# Patient Record
Sex: Male | Born: 1940
Health system: Southern US, Community
[De-identification: ages and names within clinical notes are randomized; demographics above are authoritative.]

## PROBLEM LIST (undated history)

## (undated) DIAGNOSIS — I998 Other disorder of circulatory system: Secondary | ICD-10-CM

## (undated) DIAGNOSIS — B029 Zoster without complications: Secondary | ICD-10-CM

## (undated) DIAGNOSIS — I219 Acute myocardial infarction, unspecified: Secondary | ICD-10-CM

## (undated) DIAGNOSIS — E785 Hyperlipidemia, unspecified: Secondary | ICD-10-CM

## (undated) DIAGNOSIS — I10 Essential (primary) hypertension: Secondary | ICD-10-CM

## (undated) DIAGNOSIS — K219 Gastro-esophageal reflux disease without esophagitis: Secondary | ICD-10-CM

## (undated) DIAGNOSIS — K317 Polyp of stomach and duodenum: Secondary | ICD-10-CM

## (undated) DIAGNOSIS — Z860100 Personal history of colon polyps, unspecified: Secondary | ICD-10-CM

## (undated) DIAGNOSIS — F419 Anxiety disorder, unspecified: Secondary | ICD-10-CM

## (undated) DIAGNOSIS — I2510 Atherosclerotic heart disease of native coronary artery without angina pectoris: Secondary | ICD-10-CM

## (undated) DIAGNOSIS — M1990 Unspecified osteoarthritis, unspecified site: Secondary | ICD-10-CM

## (undated) DIAGNOSIS — W3400XA Accidental discharge from unspecified firearms or gun, initial encounter: Secondary | ICD-10-CM

## (undated) HISTORY — PX: COLONOSCOPY W/ BIOPSIES: SHX1374

## (undated) HISTORY — DX: Acute myocardial infarction, unspecified: I21.9

## (undated) HISTORY — DX: Personal history of colon polyps, unspecified: Z86.0100

## (undated) HISTORY — DX: Atherosclerotic heart disease of native coronary artery without angina pectoris: I25.10

## (undated) HISTORY — DX: Hyperlipidemia, unspecified: E78.5

## (undated) HISTORY — DX: Gastro-esophageal reflux disease without esophagitis: K21.9

## (undated) HISTORY — DX: Accidental discharge from unspecified firearms or gun, initial encounter: W34.00XA

## (undated) HISTORY — DX: Essential (primary) hypertension: I10

## (undated) HISTORY — DX: Anxiety disorder, unspecified: F41.9

## (undated) HISTORY — DX: Other disorder of circulatory system: I99.8

## (undated) HISTORY — DX: Personal history of colonic polyps: Z86.010

## (undated) HISTORY — DX: Zoster without complications: B02.9

## (undated) HISTORY — DX: Unspecified osteoarthritis, unspecified site: M19.90

## (undated) HISTORY — PX: PENILE PROSTHESIS IMPLANT: SHX240

## (undated) HISTORY — PX: LUMBAR SPINE SURGERY: SHX701

## (undated) HISTORY — DX: Polyp of stomach and duodenum: K31.7

---

## 1967-03-17 DIAGNOSIS — W3400XA Accidental discharge from unspecified firearms or gun, initial encounter: Secondary | ICD-10-CM

## 1967-03-17 HISTORY — DX: Accidental discharge from unspecified firearms or gun, initial encounter: W34.00XA

## 1995-03-17 DIAGNOSIS — I219 Acute myocardial infarction, unspecified: Secondary | ICD-10-CM

## 1995-03-17 HISTORY — DX: Acute myocardial infarction, unspecified: I21.9

## 1995-03-17 HISTORY — PX: ANGIOPLASTY: SHX39

## 2001-08-14 LAB — HM DEXA SCAN: HM Dexa Scan: NORMAL

## 2001-08-30 ENCOUNTER — Encounter: Payer: Self-pay | Admitting: Internal Medicine

## 2003-04-05 ENCOUNTER — Encounter: Payer: Self-pay | Admitting: Internal Medicine

## 2003-10-12 ENCOUNTER — Encounter: Payer: Self-pay | Admitting: Internal Medicine

## 2004-03-07 ENCOUNTER — Ambulatory Visit: Payer: Self-pay | Admitting: Internal Medicine

## 2004-03-21 ENCOUNTER — Ambulatory Visit: Payer: Self-pay | Admitting: Internal Medicine

## 2004-07-18 ENCOUNTER — Ambulatory Visit: Payer: Self-pay | Admitting: Internal Medicine

## 2004-08-19 ENCOUNTER — Ambulatory Visit: Payer: Self-pay | Admitting: Internal Medicine

## 2004-11-21 ENCOUNTER — Ambulatory Visit: Payer: Self-pay | Admitting: Internal Medicine

## 2004-12-26 ENCOUNTER — Ambulatory Visit: Payer: Self-pay | Admitting: Internal Medicine

## 2005-01-12 ENCOUNTER — Ambulatory Visit: Payer: Self-pay | Admitting: Internal Medicine

## 2005-01-12 ENCOUNTER — Encounter: Admission: RE | Admit: 2005-01-12 | Discharge: 2005-01-12 | Payer: Self-pay | Admitting: Neurosurgery

## 2005-01-14 DIAGNOSIS — I998 Other disorder of circulatory system: Secondary | ICD-10-CM

## 2005-01-14 HISTORY — DX: Other disorder of circulatory system: I99.8

## 2005-01-23 ENCOUNTER — Ambulatory Visit: Payer: Self-pay | Admitting: Cardiology

## 2005-01-30 ENCOUNTER — Ambulatory Visit: Payer: Self-pay

## 2005-02-25 ENCOUNTER — Encounter: Admission: RE | Admit: 2005-02-25 | Discharge: 2005-02-25 | Payer: Self-pay | Admitting: Neurosurgery

## 2005-08-01 ENCOUNTER — Encounter: Payer: Self-pay | Admitting: Internal Medicine

## 2005-08-01 ENCOUNTER — Emergency Department (HOSPITAL_COMMUNITY): Admission: EM | Admit: 2005-08-01 | Discharge: 2005-08-01 | Payer: Self-pay | Admitting: Emergency Medicine

## 2005-08-06 ENCOUNTER — Ambulatory Visit: Payer: Self-pay | Admitting: Internal Medicine

## 2005-08-12 ENCOUNTER — Encounter: Payer: Self-pay | Admitting: Internal Medicine

## 2005-09-14 ENCOUNTER — Encounter: Admission: RE | Admit: 2005-09-14 | Discharge: 2005-09-14 | Payer: Self-pay | Admitting: Neurosurgery

## 2005-12-15 ENCOUNTER — Ambulatory Visit: Payer: Self-pay | Admitting: Internal Medicine

## 2005-12-21 ENCOUNTER — Ambulatory Visit: Payer: Self-pay | Admitting: Cardiovascular Disease

## 2005-12-29 ENCOUNTER — Encounter: Admission: RE | Admit: 2005-12-29 | Discharge: 2005-12-29 | Payer: Self-pay | Admitting: Neurosurgery

## 2006-03-17 ENCOUNTER — Ambulatory Visit: Payer: Self-pay | Admitting: Cardiovascular Disease

## 2006-04-26 ENCOUNTER — Ambulatory Visit: Payer: Self-pay | Admitting: Internal Medicine

## 2006-05-25 ENCOUNTER — Ambulatory Visit: Payer: Self-pay | Admitting: Internal Medicine

## 2006-06-22 ENCOUNTER — Encounter: Admission: RE | Admit: 2006-06-22 | Discharge: 2006-06-22 | Payer: Self-pay | Admitting: Orthopedic Surgery

## 2006-06-25 ENCOUNTER — Ambulatory Visit: Payer: Self-pay | Admitting: Cardiovascular Disease

## 2006-07-15 HISTORY — PX: LUMBAR FUSION: SHX111

## 2006-07-29 ENCOUNTER — Observation Stay (HOSPITAL_COMMUNITY): Admission: RE | Admit: 2006-07-29 | Discharge: 2006-07-30 | Payer: Self-pay | Admitting: Orthopedic Surgery

## 2006-08-06 ENCOUNTER — Telehealth (INDEPENDENT_AMBULATORY_CARE_PROVIDER_SITE_OTHER): Payer: Self-pay | Admitting: *Deleted

## 2006-09-01 DIAGNOSIS — E785 Hyperlipidemia, unspecified: Secondary | ICD-10-CM

## 2006-09-01 DIAGNOSIS — K219 Gastro-esophageal reflux disease without esophagitis: Secondary | ICD-10-CM | POA: Insufficient documentation

## 2006-09-01 DIAGNOSIS — M109 Gout, unspecified: Secondary | ICD-10-CM

## 2006-09-01 DIAGNOSIS — I25119 Atherosclerotic heart disease of native coronary artery with unspecified angina pectoris: Secondary | ICD-10-CM | POA: Insufficient documentation

## 2006-09-01 DIAGNOSIS — I10 Essential (primary) hypertension: Secondary | ICD-10-CM

## 2006-09-01 DIAGNOSIS — F411 Generalized anxiety disorder: Secondary | ICD-10-CM | POA: Insufficient documentation

## 2006-09-01 DIAGNOSIS — M1990 Unspecified osteoarthritis, unspecified site: Secondary | ICD-10-CM | POA: Insufficient documentation

## 2006-09-02 ENCOUNTER — Ambulatory Visit: Payer: Self-pay | Admitting: Internal Medicine

## 2006-09-14 ENCOUNTER — Ambulatory Visit: Payer: Self-pay | Admitting: Internal Medicine

## 2006-09-14 ENCOUNTER — Encounter: Payer: Self-pay | Admitting: Internal Medicine

## 2006-09-20 ENCOUNTER — Ambulatory Visit: Payer: Self-pay | Admitting: Internal Medicine

## 2006-09-21 ENCOUNTER — Encounter: Payer: Self-pay | Admitting: Internal Medicine

## 2006-09-21 LAB — CONVERTED CEMR LAB
Albumin: 3.3 g/dL — ABNORMAL LOW (ref 3.5–5.2)
Calcium: 9.2 mg/dL (ref 8.4–10.5)
Chloride: 111 meq/L (ref 96–112)
GFR calc non Af Amer: 50 mL/min
HDL: 41.9 mg/dL (ref >39.0)
LDL Cholesterol: 100 mg/dL — ABNORMAL HIGH (ref 0–99)
Phosphorus: 2.9 mg/dL (ref 2.3–4.6)
Potassium: 4.6 meq/L (ref 3.5–5.1)
Sodium: 143 meq/L (ref 135–145)
Triglycerides: 125 mg/dL (ref 0–149)

## 2006-12-14 ENCOUNTER — Ambulatory Visit: Payer: Self-pay | Admitting: Internal Medicine

## 2006-12-14 DIAGNOSIS — M069 Rheumatoid arthritis, unspecified: Secondary | ICD-10-CM

## 2006-12-14 LAB — CONVERTED CEMR LAB: Anti Nuclear Antibody(ANA): NEGATIVE

## 2006-12-17 LAB — CONVERTED CEMR LAB
Albumin: 3.5 g/dL (ref 3.5–5.2)
Alkaline Phosphatase: 75 units/L (ref 39–117)
Basophils Absolute: 0 10*3/uL (ref 0.0–0.1)
Bilirubin, Direct: 0.1 mg/dL (ref 0.0–0.3)
CO2: 30 meq/L (ref 19–32)
Calcium: 9.3 mg/dL (ref 8.4–10.5)
Eosinophils Absolute: 0.2 10*3/uL (ref 0.0–0.6)
Eosinophils Relative: 2.9 % (ref 0.0–5.0)
GFR calc non Af Amer: 50 mL/min
HCT: 41 % (ref 39.0–52.0)
Lymphocytes Relative: 32.9 % (ref 12.0–46.0)
MCV: 83.9 fL (ref 78.0–100.0)
Neutro Abs: 3.3 10*3/uL (ref 1.4–7.7)
Neutrophils Relative %: 52.8 % (ref 43.0–77.0)
Phosphorus: 2.9 mg/dL (ref 2.3–4.6)
Potassium: 4.5 meq/L (ref 3.5–5.1)
Sed Rate: 11 mm/hr (ref 0–20)
WBC: 6.3 10*3/uL (ref 4.5–10.5)

## 2006-12-31 ENCOUNTER — Ambulatory Visit: Payer: Self-pay | Admitting: Internal Medicine

## 2007-01-13 ENCOUNTER — Ambulatory Visit: Payer: Self-pay | Admitting: Internal Medicine

## 2007-02-17 ENCOUNTER — Ambulatory Visit: Payer: Self-pay | Admitting: Internal Medicine

## 2007-02-18 LAB — CONVERTED CEMR LAB
ALT: 33 U/L
AST: 34 U/L
Albumin: 3.5 g/dL
Alkaline Phosphatase: 72 U/L
BUN: 21 mg/dL
Basophils Absolute: 0 10*3/uL
Basophils Relative: 0.1 %
Bilirubin, Direct: 0.1 mg/dL
CO2: 29 meq/L
Calcium: 9.4 mg/dL
Chloride: 108 meq/L
Creatinine, Ser: 1.3 mg/dL
Eosinophils Absolute: 0.3 10*3/uL
Eosinophils Relative: 5.1 % — ABNORMAL HIGH
GFR calc Af Amer: 71 mL/min
GFR calc non Af Amer: 59 mL/min
Glucose, Bld: 94 mg/dL
HCT: 39.7 %
Hemoglobin: 13.7 g/dL
Lymphocytes Relative: 34.2 %
MCHC: 34.4 g/dL
MCV: 85.1 fL
Monocytes Absolute: 0.8 10*3/uL — ABNORMAL HIGH
Monocytes Relative: 13 % — ABNORMAL HIGH
Neutro Abs: 3 10*3/uL
Neutrophils Relative %: 47.6 %
Phosphorus: 3.9 mg/dL
Platelets: 211 10*3/uL
Potassium: 4.9 meq/L
RBC: 4.67 M/uL
RDW: 15.3 % — ABNORMAL HIGH
Sodium: 141 meq/L
Total Bilirubin: 0.6 mg/dL
Total Protein: 6.7 g/dL
WBC: 6.2 10*3/uL

## 2007-04-07 ENCOUNTER — Ambulatory Visit: Payer: Self-pay | Admitting: Internal Medicine

## 2007-04-08 LAB — CONVERTED CEMR LAB
BUN: 18 mg/dL (ref 6–23)
Basophils Absolute: 0 10*3/uL (ref 0.0–0.1)
Basophils Relative: 0.6 % (ref 0.0–1.0)
Bilirubin, Direct: 0.1 mg/dL (ref 0.0–0.3)
Calcium: 9.6 mg/dL (ref 8.4–10.5)
Chloride: 105 meq/L (ref 96–112)
Eosinophils Relative: 4.2 % (ref 0.0–5.0)
GFR calc non Af Amer: 54 mL/min
Lymphocytes Relative: 31.3 % (ref 12.0–46.0)
Neutrophils Relative %: 53.5 % (ref 43.0–77.0)
Phosphorus: 3.8 mg/dL (ref 2.3–4.6)
Platelets: 180 10*3/uL (ref 150–400)
Potassium: 4.8 meq/L (ref 3.5–5.1)
RDW: 13.9 % (ref 11.5–14.6)
Total Protein: 6.7 g/dL (ref 6.0–8.3)
WBC: 7.4 10*3/uL (ref 4.5–10.5)

## 2007-06-15 ENCOUNTER — Ambulatory Visit: Payer: Self-pay | Admitting: Cardiovascular Disease

## 2007-06-15 HISTORY — PX: CORONARY ANGIOPLASTY WITH STENT PLACEMENT: SHX49

## 2007-06-15 LAB — CONVERTED CEMR LAB
Alkaline Phosphatase: 66 units/L (ref 39–117)
Basophils Absolute: 0.1 10*3/uL (ref 0.0–0.1)
Bilirubin, Direct: 0.1 mg/dL (ref 0.0–0.3)
Cholesterol: 163 mg/dL (ref 0–200)
Creatinine, Ser: 1.5 mg/dL (ref 0.4–1.5)
GFR calc non Af Amer: 49 mL/min
Glucose, Bld: 88 mg/dL (ref 70–99)
INR: 0.9 (ref 0.8–1.0)
LDL Cholesterol: 105 mg/dL — ABNORMAL HIGH (ref 0–99)
Monocytes Absolute: 1 10*3/uL (ref 0.1–1.0)
Neutro Abs: 2.5 10*3/uL (ref 1.4–7.7)
Platelets: 168 10*3/uL (ref 150–400)
Potassium: 4.4 meq/L (ref 3.5–5.1)
RBC: 4.96 M/uL (ref 4.22–5.81)
RDW: 14.7 % — ABNORMAL HIGH (ref 11.5–14.6)
Total CHOL/HDL Ratio: 5
Triglycerides: 129 mg/dL (ref 0–149)
WBC: 5.6 10*3/uL (ref 4.5–10.5)

## 2007-06-21 ENCOUNTER — Inpatient Hospital Stay (HOSPITAL_BASED_OUTPATIENT_CLINIC_OR_DEPARTMENT_OTHER): Admission: RE | Admit: 2007-06-21 | Discharge: 2007-06-21 | Payer: Self-pay | Admitting: Cardiovascular Disease

## 2007-06-21 ENCOUNTER — Encounter: Payer: Self-pay | Admitting: Internal Medicine

## 2007-06-21 ENCOUNTER — Ambulatory Visit: Payer: Self-pay | Admitting: Cardiovascular Disease

## 2007-07-08 ENCOUNTER — Ambulatory Visit: Payer: Self-pay | Admitting: Internal Medicine

## 2007-07-12 LAB — CONVERTED CEMR LAB
ALT: 27 units/L (ref 0–53)
Albumin: 3.5 g/dL (ref 3.5–5.2)
Basophils Absolute: 0 10*3/uL (ref 0.0–0.1)
Basophils Relative: 0.4 % (ref 0.0–1.0)
CRP, High Sensitivity: <1 — ABNORMAL LOW (ref 0.00–5.00)
Eosinophils Absolute: 0.2 10*3/uL (ref 0.0–0.7)
Eosinophils Relative: 3.2 % (ref 0.0–5.0)
HCT: 43.4 % (ref 39.0–52.0)
MCHC: 33.8 g/dL (ref 30.0–36.0)
MCV: 86.5 fL (ref 78.0–100.0)
Monocytes Absolute: 0.7 10*3/uL (ref 0.1–1.0)
Neutrophils Relative %: 49.7 % (ref 43.0–77.0)
Platelets: 240 10*3/uL (ref 150–400)
RBC: 5.02 M/uL (ref 4.22–5.81)
RDW: 14.9 % — ABNORMAL HIGH (ref 11.5–14.6)
Total Bilirubin: 0.7 mg/dL (ref 0.3–1.2)
WBC: 6.8 10*3/uL (ref 4.5–10.5)

## 2007-08-15 ENCOUNTER — Encounter (INDEPENDENT_AMBULATORY_CARE_PROVIDER_SITE_OTHER): Payer: Self-pay | Admitting: *Deleted

## 2007-08-18 ENCOUNTER — Ambulatory Visit: Payer: Self-pay | Admitting: Internal Medicine

## 2007-09-07 ENCOUNTER — Encounter: Payer: Self-pay | Admitting: Internal Medicine

## 2007-10-05 ENCOUNTER — Encounter: Payer: Self-pay | Admitting: Internal Medicine

## 2007-12-13 ENCOUNTER — Ambulatory Visit: Payer: Self-pay | Admitting: Internal Medicine

## 2008-01-27 ENCOUNTER — Ambulatory Visit: Payer: Self-pay | Admitting: Cardiovascular Disease

## 2008-06-01 ENCOUNTER — Ambulatory Visit: Payer: Self-pay | Admitting: Family Medicine

## 2008-06-01 DIAGNOSIS — J309 Allergic rhinitis, unspecified: Secondary | ICD-10-CM | POA: Insufficient documentation

## 2008-06-05 ENCOUNTER — Ambulatory Visit: Payer: Self-pay | Admitting: Family Medicine

## 2008-06-05 DIAGNOSIS — R339 Retention of urine, unspecified: Secondary | ICD-10-CM | POA: Insufficient documentation

## 2008-06-06 ENCOUNTER — Encounter: Payer: Self-pay | Admitting: Internal Medicine

## 2008-06-11 ENCOUNTER — Ambulatory Visit: Payer: Self-pay | Admitting: Internal Medicine

## 2008-07-10 ENCOUNTER — Telehealth: Payer: Self-pay | Admitting: Internal Medicine

## 2008-07-12 ENCOUNTER — Telehealth: Payer: Self-pay | Admitting: Cardiovascular Disease

## 2008-07-16 ENCOUNTER — Telehealth: Payer: Self-pay | Admitting: Cardiovascular Disease

## 2008-07-31 ENCOUNTER — Encounter: Payer: Self-pay | Admitting: Internal Medicine

## 2008-08-02 ENCOUNTER — Encounter: Payer: Self-pay | Admitting: Internal Medicine

## 2008-08-07 ENCOUNTER — Ambulatory Visit: Payer: Self-pay | Admitting: Internal Medicine

## 2008-08-08 LAB — CONVERTED CEMR LAB
Albumin: 3.5 g/dL (ref 3.5–5.2)
Alkaline Phosphatase: 74 units/L (ref 39–117)
Basophils Absolute: 0.1 10*3/uL (ref 0.0–0.1)
Basophils Relative: 1.2 % (ref 0.0–3.0)
Chloride: 113 meq/L — ABNORMAL HIGH (ref 96–112)
Creatinine, Ser: 1.4 mg/dL (ref 0.4–1.5)
Eosinophils Absolute: 0.3 10*3/uL (ref 0.0–0.7)
Eosinophils Relative: 4.6 % (ref 0.0–5.0)
HDL: 41.9 mg/dL (ref >39.00)
LDL Cholesterol: 104 mg/dL — ABNORMAL HIGH (ref 0–99)
MCHC: 34.8 g/dL (ref 30.0–36.0)
Monocytes Absolute: 0.8 10*3/uL (ref 0.1–1.0)
Neutro Abs: 3 10*3/uL (ref 1.4–7.7)
RBC: 4.36 M/uL (ref 4.22–5.81)
RDW: 14.5 % (ref 11.5–14.6)
Sodium: 142 meq/L (ref 135–145)
WBC: 6.3 10*3/uL (ref 4.5–10.5)

## 2008-08-09 ENCOUNTER — Telehealth: Payer: Self-pay | Admitting: Internal Medicine

## 2008-08-14 ENCOUNTER — Telehealth: Payer: Self-pay | Admitting: Internal Medicine

## 2008-08-22 ENCOUNTER — Ambulatory Visit: Payer: Self-pay | Admitting: Internal Medicine

## 2008-08-22 DIAGNOSIS — R609 Edema, unspecified: Secondary | ICD-10-CM

## 2008-08-22 LAB — CONVERTED CEMR LAB
Bilirubin Urine: NEGATIVE
Blood in Urine, dipstick: NEGATIVE
Nitrite: NEGATIVE
Protein, U semiquant: NEGATIVE
Urobilinogen, UA: 0.2

## 2008-08-23 ENCOUNTER — Encounter: Payer: Self-pay | Admitting: Internal Medicine

## 2008-08-24 ENCOUNTER — Ambulatory Visit: Payer: Self-pay | Admitting: Cardiovascular Disease

## 2008-09-04 ENCOUNTER — Ambulatory Visit: Payer: Self-pay

## 2008-09-04 ENCOUNTER — Encounter: Payer: Self-pay | Admitting: Cardiovascular Disease

## 2008-09-05 ENCOUNTER — Telehealth: Payer: Self-pay | Admitting: Internal Medicine

## 2008-09-05 ENCOUNTER — Ambulatory Visit: Payer: Self-pay | Admitting: Internal Medicine

## 2008-09-06 LAB — CONVERTED CEMR LAB
BUN: 28 mg/dL — ABNORMAL HIGH (ref 6–23)
Chloride: 107 meq/L (ref 96–112)
Creatinine, Ser: 1.5 mg/dL (ref 0.4–1.5)
Potassium: 4.3 meq/L (ref 3.5–5.1)

## 2008-09-21 ENCOUNTER — Encounter: Payer: Self-pay | Admitting: Internal Medicine

## 2008-10-19 ENCOUNTER — Ambulatory Visit: Payer: Self-pay | Admitting: Internal Medicine

## 2008-10-25 ENCOUNTER — Ambulatory Visit: Payer: Self-pay | Admitting: Internal Medicine

## 2008-10-31 ENCOUNTER — Telehealth: Payer: Self-pay | Admitting: Internal Medicine

## 2008-11-02 ENCOUNTER — Encounter: Payer: Self-pay | Admitting: Internal Medicine

## 2008-12-06 ENCOUNTER — Encounter: Payer: Self-pay | Admitting: Internal Medicine

## 2008-12-20 ENCOUNTER — Ambulatory Visit: Payer: Self-pay | Admitting: Internal Medicine

## 2009-01-03 ENCOUNTER — Ambulatory Visit: Payer: Self-pay | Admitting: Internal Medicine

## 2009-01-03 DIAGNOSIS — Z860100 Personal history of colon polyps, unspecified: Secondary | ICD-10-CM | POA: Insufficient documentation

## 2009-01-30 ENCOUNTER — Telehealth: Payer: Self-pay | Admitting: Internal Medicine

## 2009-01-30 ENCOUNTER — Encounter: Payer: Self-pay | Admitting: Internal Medicine

## 2009-02-12 ENCOUNTER — Ambulatory Visit: Payer: Self-pay | Admitting: Internal Medicine

## 2009-02-14 LAB — CONVERTED CEMR LAB
ALT: 23 units/L (ref 0–53)
AST: 26 units/L (ref 0–37)
Albumin: 3.6 g/dL (ref 3.5–5.2)
Alkaline Phosphatase: 58 units/L (ref 39–117)
BUN: 25 mg/dL — ABNORMAL HIGH (ref 6–23)
Basophils Absolute: 0.1 10*3/uL (ref 0.0–0.1)
Basophils Relative: 1.5 % (ref 0.0–3.0)
Bilirubin, Direct: 0.1 mg/dL (ref 0.0–0.3)
CO2: 25 meq/L (ref 19–32)
Calcium: 9.3 mg/dL (ref 8.4–10.5)
Chloride: 108 meq/L (ref 96–112)
Cholesterol: 183 mg/dL (ref 0–200)
Creatinine, Ser: 1.8 mg/dL — ABNORMAL HIGH (ref 0.4–1.5)
Eosinophils Absolute: 0.4 10*3/uL (ref 0.0–0.7)
Eosinophils Relative: 6.3 % — ABNORMAL HIGH (ref 0.0–5.0)
GFR calc non Af Amer: 39.87 mL/min (ref >60)
Glucose, Bld: 96 mg/dL (ref 70–99)
HCT: 39.6 % (ref 39.0–52.0)
HDL: 35.8 mg/dL — ABNORMAL LOW (ref >39.00)
Hemoglobin: 13.4 g/dL (ref 13.0–17.0)
LDL Cholesterol: 108 mg/dL — ABNORMAL HIGH (ref 0–99)
Lymphocytes Relative: 30 % (ref 12.0–46.0)
Lymphs Abs: 2.1 10*3/uL (ref 0.7–4.0)
MCHC: 33.9 g/dL (ref 30.0–36.0)
MCV: 91.1 fL (ref 78.0–100.0)
Monocytes Absolute: 0.8 10*3/uL (ref 0.1–1.0)
Monocytes Relative: 11.4 % (ref 3.0–12.0)
Neutro Abs: 3.6 10*3/uL (ref 1.4–7.7)
Neutrophils Relative %: 50.8 % (ref 43.0–77.0)
Phosphorus: 2.9 mg/dL (ref 2.3–4.6)
Platelets: 205 10*3/uL (ref 150.0–400.0)
Potassium: 4.4 meq/L (ref 3.5–5.1)
RBC: 4.35 M/uL (ref 4.22–5.81)
RDW: 15.6 % — ABNORMAL HIGH (ref 11.5–14.6)
Sodium: 139 meq/L (ref 135–145)
TSH: 0.98 microintl units/mL (ref 0.35–5.50)
Total Bilirubin: 0.7 mg/dL (ref 0.3–1.2)
Total CHOL/HDL Ratio: 5
Total Protein: 6.5 g/dL (ref 6.0–8.3)
Triglycerides: 196 mg/dL — ABNORMAL HIGH (ref 0.0–149.0)
VLDL: 39.2 mg/dL (ref 0.0–40.0)
WBC: 7 10*3/uL (ref 4.5–10.5)

## 2009-02-19 ENCOUNTER — Ambulatory Visit: Payer: Self-pay | Admitting: Internal Medicine

## 2009-02-19 LAB — HM COLONOSCOPY

## 2009-03-01 ENCOUNTER — Ambulatory Visit: Payer: Self-pay | Admitting: Cardiovascular Disease

## 2009-06-21 ENCOUNTER — Telehealth: Payer: Self-pay | Admitting: Internal Medicine

## 2009-08-19 ENCOUNTER — Ambulatory Visit: Payer: Self-pay | Admitting: Internal Medicine

## 2009-08-20 LAB — CONVERTED CEMR LAB
ALT: 25 units/L (ref 0–53)
Bilirubin, Direct: 0.2 mg/dL (ref 0.0–0.3)
Chloride: 108 meq/L (ref 96–112)
Eosinophils Absolute: 0.4 10*3/uL (ref 0.0–0.7)
GFR calc non Af Amer: 54.55 mL/min (ref >60)
Glucose, Bld: 91 mg/dL (ref 70–99)
Lymphs Abs: 2.3 10*3/uL (ref 0.7–4.0)
MCV: 83 fL (ref 78.0–100.0)
Monocytes Absolute: 0.7 10*3/uL (ref 0.1–1.0)
Monocytes Relative: 10.3 % (ref 3.0–12.0)
Neutro Abs: 3.1 10*3/uL (ref 1.4–7.7)
Phosphorus: 3.6 mg/dL (ref 2.3–4.6)
Total Bilirubin: 0.6 mg/dL (ref 0.3–1.2)
Total CHOL/HDL Ratio: 4
Total Protein: 6.6 g/dL (ref 6.0–8.3)
Triglycerides: 103 mg/dL (ref 0.0–149.0)

## 2009-09-02 ENCOUNTER — Ambulatory Visit: Payer: Self-pay | Admitting: Cardiovascular Disease

## 2009-09-02 DIAGNOSIS — R42 Dizziness and giddiness: Secondary | ICD-10-CM | POA: Insufficient documentation

## 2009-09-21 ENCOUNTER — Emergency Department: Payer: Self-pay | Admitting: Emergency Medicine

## 2009-09-21 ENCOUNTER — Encounter: Payer: Self-pay | Admitting: Internal Medicine

## 2009-09-27 ENCOUNTER — Encounter: Payer: Self-pay | Admitting: Internal Medicine

## 2009-10-02 ENCOUNTER — Telehealth: Payer: Self-pay | Admitting: Internal Medicine

## 2009-11-14 ENCOUNTER — Ambulatory Visit: Payer: Self-pay | Admitting: Internal Medicine

## 2009-11-19 ENCOUNTER — Ambulatory Visit: Payer: Self-pay | Admitting: Internal Medicine

## 2010-01-14 ENCOUNTER — Encounter: Payer: Self-pay | Admitting: Internal Medicine

## 2010-02-18 ENCOUNTER — Ambulatory Visit: Payer: Self-pay | Admitting: Internal Medicine

## 2010-02-18 DIAGNOSIS — H8109 Meniere's disease, unspecified ear: Secondary | ICD-10-CM | POA: Insufficient documentation

## 2010-02-24 ENCOUNTER — Ambulatory Visit: Payer: Self-pay | Admitting: Cardiovascular Disease

## 2010-02-24 ENCOUNTER — Encounter: Payer: Self-pay | Admitting: Cardiovascular Disease

## 2010-04-01 ENCOUNTER — Other Ambulatory Visit: Payer: Self-pay | Admitting: Internal Medicine

## 2010-04-01 ENCOUNTER — Ambulatory Visit
Admission: RE | Admit: 2010-04-01 | Discharge: 2010-04-01 | Payer: Self-pay | Source: Home / Self Care | Attending: Internal Medicine | Admitting: Internal Medicine

## 2010-04-01 LAB — RENAL FUNCTION PANEL
Albumin: 3.7 g/dL (ref 3.5–5.2)
BUN: 25 mg/dL — ABNORMAL HIGH (ref 6–23)
CO2: 28 mEq/L (ref 19–32)
Calcium: 8.9 mg/dL (ref 8.4–10.5)
Chloride: 106 mEq/L (ref 96–112)
Creatinine, Ser: 1.5 mg/dL (ref 0.4–1.5)
GFR: 49.04 mL/min — ABNORMAL LOW (ref >60.00)
Glucose, Bld: 96 mg/dL (ref 70–99)
Phosphorus: 2.3 mg/dL (ref 2.3–4.6)
Potassium: 5 mEq/L (ref 3.5–5.1)
Sodium: 141 mEq/L (ref 135–145)

## 2010-04-15 NOTE — Miscellaneous (Signed)
Summary: Controlled Substances Contract / [facility]  Controlled Substances Contract / [facility]   Imported By: Lennie Odor 08/23/2009 16:17:37  _____________________________________________________________________  External Attachment:    Type:   Image     Comment:   External Document

## 2010-04-15 NOTE — Letter (Signed)
Summary: [facility]  [facility]   Imported By: Maryln Gottron 10/31/2009 14:19:56  _____________________________________________________________________  External Attachment:    Type:   Image     Comment:   External Document  Appended Document: [facility] worsening vertigo and sensorineural hearing loss Checking ENG consider diuretic

## 2010-04-15 NOTE — Progress Notes (Signed)
Summary: needs written scripts for mail order  Phone Note Refill Request Call back at Home Phone [phone] Message from:  Patient  Refills Requested: Medication #1:  COLCHICINE 0.6 MG TABS Take 1 tabllet up to 3 times daily for gout  Medication #2:  ALLOPURINOL 300 MG TABS 1 tab daily to help prevent gout.  Medication #3:  FUROSEMIDE 40 MG TABS about 1 tab twice a week Pt needs 90 day written scripts for mail order, call when ready and he will pick up.  Initial call taken by: Lowella Petties CMA,  October 02, 2009 8:07 AM  Follow-up for Phone Call        Rx written Please let hiim know that they may not fill the colchicine since this is only available as a branded drug now, colcrys Follow-up by: Cindee Salt MD,  October 02, 2009 1:52 PM  Additional Follow-up for Phone Call Additional follow up Details #1::        Patient notified as instructed by telephone and rx's are up front and ready for pickup. Additional Follow-up by: Sydell Axon LPN,  October 02, 2009 3:11 PM    New/Updated Medications: FUROSEMIDE 40 MG TABS (FUROSEMIDE) 1 tab daily as directed Prescriptions: FUROSEMIDE 40 MG TABS (FUROSEMIDE) 1 tab daily as directed  #90 x 1   Entered and Authorized by:   Cindee Salt MD   Signed by:   Cindee Salt MD on 10/02/2009   Method used:   Print then Give to Patient   RxID:   [id number] ALLOPURINOL 300 MG TABS (ALLOPURINOL) 1 tab daily to help prevent gout  #90 x 3   Entered and Authorized by:   Cindee Salt MD   Signed by:   Cindee Salt MD on 10/02/2009   Method used:   Print then Give to Patient   RxID:   [id number] COLCHICINE 0.6 MG TABS (COLCHICINE) Take 1 tabllet up to 3 times daily for gout  #90 x 3   Entered and Authorized by:   Cindee Salt MD   Signed by:   Cindee Salt MD on 10/02/2009   Method used:   Print then Give to Patient   RxID:   [id number]
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
## 2010-04-15 NOTE — Assessment & Plan Note (Signed)
Summary: FLU SHOT/DLO  Nurse Visit   Allergies: 1)  * Statins  Immunizations Administered:  Influenza Vaccine # 1:    Vaccine Type: Fluvax 3+    Site: right deltoid    Mfr: GlaxoSmithKline    Dose: 0.5 ml    Route: IM    Given by: Mervin Hack CMA (AAMA)    Exp. Date: 09/13/2010    Lot #: [id number]    VIS given: 10/08/09 version given November 14, 2009.  Flu Vaccine Consent Questions:    Do you have a history of severe allergic reactions to this vaccine? no    Any prior history of allergic reactions to egg and/or gelatin? no    Do you have a sensitivity to the preservative Thimersol? no    Do you have a past history of Guillan-Barre Syndrome? no    Do you currently have an acute febrile illness? no    Have you ever had a severe reaction to latex? no    Vaccine information given and explained to patient? yes  Orders Added: 1)  Flu Vaccine 27yrs + [90658] 2)  Admin 1st Vaccine [09811]

## 2010-04-15 NOTE — Assessment & Plan Note (Signed)
Summary: f13m   Visit Type:  Follow-up Primary Provider:  Cindee Salt MD  CC:  no complaints.  History of Present Illness: This is a 70 year old gentleman with moderate diffuse CAD presenting today for followup evaluation. He denies chest pain or tightness. No dyspnea, orthopnea, or PND. He does have occasional leg edema and takes lasix on an as needed basis. He complains of occasional lightheaded episodes, sometimes associated with nausea. No syncope or palpitations. He reports good fluid intake. No other complaints reported.  Current Medications (verified): 1)  Colchicine 0.6 Mg Tabs (Colchicine) .... Take 1 Tabllet Up To 3 Times Daily For Gout 2)  Omeprazole 20 Mg  Cpdr (Omeprazole) .... Take 1 Tablet By Mouth Two Times A Day 3)  Zetia 10 Mg  Tabs (Ezetimibe) .... Take 1 Tablet By Mouth Once A Day 4)  Metoprolol Tartrate 25 Mg Tabs (Metoprolol Tartrate) .... Take 1 Tablet By Mouth Once A Day 5)  Lisinopril 40 Mg Tabs (Lisinopril) .... Take One Tablet By Mouth Daily 6)  Furosemide 40 Mg Tabs (Furosemide) .... About 1 Tab Twice A Week 7)  Nitrolingual 0.4 Mg/spray Soln (Nitroglycerin) .... One Spray Under Tongue Every 5 Minutes As Needed For Chest Pain---May Repeat Times Three 8)  Flonase 50 Mcg/act Susp (Fluticasone Propionate) .... As Needed 9)  Albuterol Sulfate 2 Mg Tabs (Albuterol Sulfate) .... As Needed 10)  Cvs Aspirin 325 Mg  Tabs (Aspirin) .... Take 1 Tablet By Mouth Once A Day 11)  Allopurinol 300 Mg Tabs (Allopurinol) .Marland Kitchen.. 1 Tab Daily To Help Prevent Gout  Allergies (verified): 1)  * Statins  Past History:  Past medical history reviewed for relevance to current acute and chronic problems.  Past Medical History: Reviewed history from 02/12/2009 and no changes required. Anxiety Colonic polyps, hx of--tubular adenoma Coronary artery disease, nonobstructive by cath 2009----------------------------Dr  Excell Seltzer GERD Gout----------------------------------------------------------------------------------------Dr Devashwar Hyperlipidemia Hypertension Osteoarthritis Rheumatoid arthritis???  Review of Systems       Negative except as per HPI   Vital Signs:  Patient profile:   70 year old male Height:      72 inches Weight:      233 pounds Pulse rate:   68 / minute Resp:     16 per minute BP sitting:   111 / 61  (left arm) Cuff size:   large  Vitals Entered By: Burnett Kanaris, CNA (September 02, 2009 9:07 AM)  Physical Exam  General:  Pt is alert and oriented, in no acute distress. HEENT: normal Neck: normal carotid upstrokes without bruits, JVP normal Lungs: CTA CV: RRR without murmur or gallop Abd: soft, NT, positive BS, no bruit, no organomegaly Ext: no clubbing, cyanosis, or edema. peripheral pulses 2+ and equal Skin: warm and dry without rash    EKG  Procedure date:  09/02/2009  Findings:      NSR with PAC's, otherwise within normal limits. HR 68 bpm.  Impression & Recommendations:  Problem # 1:  CORONARY ARTERY DISEASE (ICD-414.00) Stable without angina. Continue current medical therapy.  His updated medication list for this problem includes:    Metoprolol Tartrate 25 Mg Tabs (Metoprolol tartrate) .Marland Kitchen... Take 1 tablet by mouth once a day    Lisinopril 40 Mg Tabs (Lisinopril) .Marland Kitchen... Take one tablet by mouth daily    Nitrolingual 0.4 Mg/spray Soln (Nitroglycerin) ..... One spray under tongue every 5 minutes as needed for chest pain---may repeat times three    Cvs Aspirin 325 Mg Tabs (Aspirin) .Marland Kitchen... Take 1 tablet by mouth  once a day  Orders: EKG w/ Interpretation (93000)  Problem # 2:  HYPERLIPIDEMIA (ICD-272.4) Statin-intolerant. Continue ezetemide.  His updated medication list for this problem includes:    Zetia 10 Mg Tabs (Ezetimibe) .Marland Kitchen... Take 1 tablet by mouth once a day  CHOL: 197 (08/19/2009)   LDL: 129 (08/19/2009)   HDL: 47.80 (08/19/2009)   TG: 103.0  (08/19/2009) CRP: < 1.00 mg/L (07/08/2007)     Problem # 3:  DIZZINESS (ICD-780.4) I asked pt to obtain a home BP monitor and begin to record BP's to make sure we are not overtreating his HTN. Office BP's have been ok. Also asked him to push fluids.  Patient Instructions: 1)  Your physician recommends that you continue on your current medications as directed. Please refer to the Current Medication list given to you today. 2)  Your physician wants you to follow-up in:  6 MONTHS.  You will receive a reminder letter in the mail two months in advance. If you don't receive a letter, please call our office to schedule the follow-up appointment.
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
## 2010-04-15 NOTE — Progress Notes (Signed)
Summary: Pain in groin and hip  Phone Note Call from Patient Call back at Home Phone [phone]   Caller: Patient Call For: Jonathan Salt MD Summary of Call: Patient is complaining of pain in his groin and hip area.  Says it feels like a pulled muscle, pain started on Tuesday and he thought it would work itself out but it is no better.  He has been taking Tylenol and using heat but no relief.  Pain does not radiate anywhere but is localized.   Initial call taken by: Linde Gillis CMA Duncan Dull),  June 21, 2009 8:41 AM  Follow-up for Phone Call        sounds like a pull but can't be sure If he would like, add him on at end of my afternoon  okay to wait the weekend if he prefers and see how he is doing and come in next week as needed  Follow-up by: Jonathan Salt MD,  June 21, 2009 1:40 PM  Additional Follow-up for Phone Call Additional follow up Details #1::        Pt. said he would wait and see if it felt better over the weekend.  If it gets worse,he'll call on Monday morning,otherwise he scheduled an appt. for Tuesday,April 12 @ 12:15. Additional Follow-up by: Beau Fanny,  June 21, 2009 1:51 PM    Additional Follow-up for Phone Call Additional follow up Details #2::    okay Follow-up by: Jonathan Salt MD,  June 21, 2009 2:00 PM

## 2010-04-17 NOTE — Assessment & Plan Note (Signed)
Summary: f85m   Visit Type:  6 months follow up Primary Provider:  Cindee Salt MD  CC:  No cardiac complaints.  History of Present Illness: This is a 70 year old gentleman with moderate diffuse CAD presenting today for followup evaluation. He denies chest pain or tightness. No dyspnea, orthopnea, or PND. He has had problems with osteoarthritis and is physically limited since this has progressed. He has leg swelling, and uses furosemide on an as needed basis. Dr Alphonsus Sias recommended daily use since the patient's edema has been persistent. He does not tolerate compression stockings. He does elevated legs when at home.  Current Medications (verified): 1)  Colchicine 0.6 Mg Tabs (Colchicine) .... Take 1 Tabllet Up To 3 Times Daily For Gout 2)  Zetia 10 Mg  Tabs (Ezetimibe) .... Take 1 Tablet By Mouth Once A Day 3)  Metoprolol Tartrate 25 Mg Tabs (Metoprolol Tartrate) .... Take 1 Tablet By Mouth Once A Day 4)  Amlodipine Besylate 10 Mg Tabs (Amlodipine Besylate) .... Take One Tablet By Mouth Daily 5)  Furosemide 40 Mg Tabs (Furosemide) .Marland Kitchen.. 1 Tab Every Other Day To Control Fluid 6)  Allopurinol 300 Mg Tabs (Allopurinol) .Marland Kitchen.. 1 Tab Daily To Help Prevent Gout 7)  Omeprazole 20 Mg  Cpdr (Omeprazole) .... Take 1 Tablet By Mouth Two Times A Day 8)  Nitrolingual 0.4 Mg/spray Soln (Nitroglycerin) .... One Spray Under Tongue Every 5 Minutes As Needed For Chest Pain---May Repeat Times Three 9)  Flonase 50 Mcg/act Susp (Fluticasone Propionate) .... As Needed 10)  Albuterol Sulfate 2 Mg Tabs (Albuterol Sulfate) .... As Needed 11)  Cvs Aspirin 325 Mg  Tabs (Aspirin) .... Take 1 Tablet By Mouth Once A Day 12)  Ibuprofen 600 Mg Tabs (Ibuprofen) .Marland Kitchen.. 1 Tab By Mouth Three Times A Day As Needed 13)  Hydrocodone-Acetaminophen 5-500 Mg Tabs (Hydrocodone-Acetaminophen) .Marland Kitchen.. 1 By Mouth Three Times A Day As Needed For Severe Pain 14)  Diazepam 2 Mg Tabs (Diazepam) .Marland Kitchen.. 1-2 Tabs By Mouth Three Times A Day For  Dizziness of Meniere's Disease  Allergies: 1)  * Statins  Past History:  Past medical history reviewed for relevance to current acute and chronic problems.  Past Medical History: Reviewed history from 02/18/2010 and no changes required. Anxiety Colonic polyps, hx of--tubular adenoma Coronary artery disease, nonobstructive by cath 2009----------------------------Dr Excell Seltzer GERD Gout----------------------------------------------------------------------------------------Dr Devashwar Hyperlipidemia Hypertension Osteoarthritis Rheumatoid arthritis??? Meniere's  Review of Systems       Positive for foot and hand pain, leg swelling, otherwise negative except as per HPI  Vital Signs:  Patient profile:   70 year old male Height:      72 inches Weight:      244.75 pounds BMI:     33.31 Pulse rate:   56 / minute Pulse rhythm:   regular Resp:     20 per minute BP sitting:   130 / 70  (left arm) Cuff size:   large  Vitals Entered By: Vikki Ports (February 24, 2010 11:58 AM)  Physical Exam  General:  Pt is alert and oriented, in no acute distress. HEENT: normal Neck: normal carotid upstrokes without bruits, JVP normal Lungs: CTA CV: RRR without murmur or gallop Abd: soft, NT, positive BS, no bruit, no organomegaly Ext: no clubbing, cyanosis, or edema. peripheral pulses 2+ and equal Skin: warm and dry without rash    EKG  Procedure date:  02/24/2010  Findings:      Sinus bradycardia, HR 56 bpm, within normal limits.  Impression & Recommendations:  Problem # 1:  CORONARY ARTERY DISEASE (ICD-414.00) Stable without angina. Continue risk reduction measures. He is symptom-free and on appropriate med Rx as below.  His updated medication list for this problem includes:    Metoprolol Tartrate 25 Mg Tabs (Metoprolol tartrate) .Marland Kitchen... Take 1 tablet by mouth once a day    Amlodipine Besylate 10 Mg Tabs (Amlodipine besylate) .Marland Kitchen... Take one tablet by mouth daily    Nitrolingual  0.4 Mg/spray Soln (Nitroglycerin) ..... One spray under tongue every 5 minutes as needed for chest pain---may repeat times three    Cvs Aspirin 325 Mg Tabs (Aspirin) .Marland Kitchen... Take 1 tablet by mouth once a day  Orders: EKG w/ Interpretation (93000)  Problem # 2:  HYPERLIPIDEMIA (ICD-272.4) Statin-intolerant. Continue current management.  His updated medication list for this problem includes:    Zetia 10 Mg Tabs (Ezetimibe) .Marland Kitchen... Take 1 tablet by mouth once a day  Orders: EKG w/ Interpretation (93000)  CHOL: 197 (08/19/2009)   LDL: 129 (08/19/2009)   HDL: 47.80 (08/19/2009)   TG: 103.0 (08/19/2009) CRP: < 1.00 mg/L (07/08/2007)     Problem # 3:  HYPERTENSION (ICD-401.9) BP controlled on current regimen.  His updated medication list for this problem includes:    Metoprolol Tartrate 25 Mg Tabs (Metoprolol tartrate) .Marland Kitchen... Take 1 tablet by mouth once a day    Amlodipine Besylate 10 Mg Tabs (Amlodipine besylate) .Marland Kitchen... Take one tablet by mouth daily    Furosemide 40 Mg Tabs (Furosemide) .Marland Kitchen... 1 tab every other day to control fluid    Cvs Aspirin 325 Mg Tabs (Aspirin) .Marland Kitchen... Take 1 tablet by mouth once a day  BP today: 130/70 Prior BP: 132/70 (02/18/2010)  Labs Reviewed: K+: 4.9 (08/19/2009) Creat: : 1.4 (08/19/2009)   Chol: 197 (08/19/2009)   HDL: 47.80 (08/19/2009)   LDL: 129 (08/19/2009)   TG: 103.0 (08/19/2009)  Problem # 4:  EDEMA (ICD-782.3) Agree with daily diuretic use. With daily lasix recommended increasing dietary potassium and reviewed potassium-rich foods with him.  Patient Instructions: 1)  Your physician wants you to follow-up in: 1 YEAR.   You will receive a reminder letter in the mail two months in advance. If you don't receive a letter, please call our office to schedule the follow-up appointment. 2)  Your physician recommends that you continue on your current medications as directed. Please refer to the Current Medication list given to you today.
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
## 2010-04-29 ENCOUNTER — Telehealth: Payer: Self-pay | Admitting: Internal Medicine

## 2010-05-07 NOTE — Progress Notes (Signed)
Summary: mobic is not helping pain  Phone Note Call from Patient Call back at Home Phone [phone]   Caller: Patient Call For: Jonathan Salt MD Summary of Call: Pt has been taking mobic for about a month and it is not helping his pain at all.  Also, the nose bleeds that he was having before have become more frequent since he started taking this.  Please advise.  [facility]. Initial call taken by: Lowella Petties CMA, AAMA,  April 29, 2010 9:41 AM  Follow-up for Phone Call        next step is hydrocodone which he has had in the past (has he thought this was useful?) or trying something I am not sure if he has tried (tramadol)  see if he has a preference Jonathan Salt MD  April 29, 2010 1:36 PM   spoke with patient and he didn't think the hydrocodone helped, he would like to try the tramadol. Pt also asked what to do about the nose bleeds? should they stop since he's stopping the medication? please advise. DeShannon Katrinka Blazing CMA Duncan Dull)  April 29, 2010 3:04 PM   The mobic can increase nose bleeds so I expect that to help okay to send tramadol Rx 50mg  three times a day as needed  #90 x 0 Follow-up by: Jonathan Salt MD,  April 30, 2010 7:39 AM  Additional Follow-up for Phone Call Additional follow up Details #1::        Rx Called In. Spoke with patient and advised results.  Additional Follow-up by: Mervin Hack CMA (AAMA),  April 30, 2010 10:08 AM    New/Updated Medications: TRAMADOL HCL 50 MG TABS (TRAMADOL HCL) take 1 by mouth three times a day as needed Prescriptions: TRAMADOL HCL 50 MG TABS (TRAMADOL HCL) take 1 by mouth three times a day as needed  #90 x 0   Entered by:   Mervin Hack CMA (AAMA)   Authorized by:   Jonathan Salt MD   Signed by:   Mervin Hack CMA (AAMA) on 04/30/2010   Method used:   Electronically to        [facility]* (retail)       [address]       [place], [place]  [address]       Ph: [phone]    Fax: [phone]   RxID:   [id number]

## 2010-05-13 ENCOUNTER — Ambulatory Visit (INDEPENDENT_AMBULATORY_CARE_PROVIDER_SITE_OTHER): Payer: Medicare Other | Admitting: Internal Medicine

## 2010-05-13 ENCOUNTER — Encounter: Payer: Self-pay | Admitting: Internal Medicine

## 2010-05-13 DIAGNOSIS — M069 Rheumatoid arthritis, unspecified: Secondary | ICD-10-CM

## 2010-05-15 ENCOUNTER — Encounter: Payer: Self-pay | Admitting: Internal Medicine

## 2010-05-17 ENCOUNTER — Encounter: Payer: Self-pay | Admitting: Internal Medicine
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
## 2010-05-27 NOTE — Consult Note (Signed)
Summary: [facility]  [facility]   Imported By: Maryln Gottron 05/22/2010 13:47:55  _____________________________________________________________________  External Attachment:    Type:   Image     Comment:   External Document  Appended Document: [facility] trigger fingers injected Probably not inflammatory arthritis ?may need carpal tunnel release

## 2010-06-02 ENCOUNTER — Encounter: Payer: Self-pay | Admitting: Internal Medicine

## 2010-06-18 ENCOUNTER — Other Ambulatory Visit: Payer: Self-pay

## 2010-06-25 ENCOUNTER — Other Ambulatory Visit: Payer: Self-pay | Admitting: *Deleted

## 2010-06-25 MED ORDER — FUROSEMIDE 40 MG PO TABS: 40.0000 mg | ORAL_TABLET | Freq: Every day | ORAL | Status: DC

## 2010-07-02 ENCOUNTER — Ambulatory Visit (INDEPENDENT_AMBULATORY_CARE_PROVIDER_SITE_OTHER): Payer: Medicare Other | Admitting: Internal Medicine

## 2010-07-02 ENCOUNTER — Encounter: Payer: Self-pay | Admitting: Internal Medicine

## 2010-07-02 VITALS — BP 140/70 | HR 66 | Temp 98.0°F | Ht 72.0 in | Wt 247.0 lb

## 2010-07-02 DIAGNOSIS — G5603 Carpal tunnel syndrome, bilateral upper limbs: Secondary | ICD-10-CM

## 2010-07-02 DIAGNOSIS — G5600 Carpal tunnel syndrome, unspecified upper limb: Secondary | ICD-10-CM

## 2010-07-02 DIAGNOSIS — R609 Edema, unspecified: Secondary | ICD-10-CM

## 2010-07-02 MED ORDER — FUROSEMIDE 40 MG PO TABS: 40.0000 mg | ORAL_TABLET | Freq: Every day | ORAL | Status: DC
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
## 2010-07-15 ENCOUNTER — Ambulatory Visit: Payer: Medicare Other | Admitting: Internal Medicine

## 2010-07-29 NOTE — Cardiovascular Report (Signed)
NAME:  TEMESGEN, WEIGHTMAN NO.:  [id number]   MEDICAL RECORD NO.:  [id number]          PATIENT TYPE:  OIB   LOCATION:  1963                         FACILITY:  [facility]   PHYSICIAN:  [name]. Excell Seltzer, MD  DATE OF BIRTH:  15-Aug-1940   DATE OF PROCEDURE:  06/21/2007  DATE OF DISCHARGE:                            CARDIAC CATHETERIZATION   PROCEDURE:  Left heart catheterization, selective coronary angiography,  left ventricular angiography.   INDICATIONS:  Mr. Dishman is a 70 year old gentleman with known CAD.  He  has had a prior myocardial infarction several years back.  He underwent  angioplasty at that time.  He presented to the office last week with  increased angina.  His symptoms were suggestive of ischemic heart  disease, as he complained of chest pain radiating to the jaw at times of  emotional stress.  I elected to proceed directly with cardiac  catheterization because of his high pretest probability.   DESCRIPTION OF PROCEDURE:  The risks and indications of procedure were  reviewed with the patient.  Informed consent was obtained.  The right  groin was prepped, draped, and anesthetized with 1% lidocaine using  modified Seldinger technique.  A 4-French sheath was placed in the right  femoral artery.  Standard 4-French Judkins catheters were used for  coronary angiography.  The 4-French 3-D RC engaged the conus branch  multiple times, therefore I changed out to a 4-French JR-4 catheter.  An  angled pigtail catheter was used for ventriculography.  A pullback  across the aortic valve was done.  The patient tolerated the procedure  well.  All catheter exchanges were performed over a guidewire.  There  were no immediate complications.   FINDINGS:  Aortic pressure 122/58 with a mean of 83, left ventricular  pressure 123/19.   CORONARY ANGIOGRAPHY:  1. Left mainstem:  The left mainstem is calcified.  It is widely      patent with no significant stenosis.  The left  main bifurcates into      the LAD and left circumflex.  2. LAD:  The LAD is heavily-calcified in the proximal aspect.  There      is nonobstructive plaque through that region with 20-30% stenosis      from the ostium into the proximal portion of the vessel.  This is      clearly appears nonobstructive.  The first diagonal branch is      small.  The second diagonal branch is moderate-sized.  Just      proximal to second diagonal branch, there is diffuse nonobstructive      disease in the range of 40%.  Beyond that area in the mid and      distal LAD, there are no significant stenoses identified.  The      moderate-sized diagonal branch from the mid-LAD bifurcates into      multiple vessels and has no significant stenosis.  3. Left circumflex:  The left circumflex is a large vessel.  It      supplies two OM branches and a posterolateral branch.  The two OM  course in a second and third OM territory.  There is mild      nonobstructive plaque in the proximal and mid circumflex.  The OM      branches are widely patent.  The distal circumflex has a 30-40%      stenosis.  The posterolateral branch is widely patent.  4. Right coronary artery:  The right coronary artery is diffusely      diseased.  There are no severe stenoses identified.  The midportion      of the vessel has a 50% stenosis.  The proximal portion has an      irregular 30-40% stenosis.  There is a small PDA branch that is      widely patent.  5. Left ventriculography shows normal LV function.  The LVEF is 65%.      There is no mitral regurgitation.   ASSESSMENT:  1. Diffuse nonobstructive coronary artery disease.  2. Normal left ventricular function.   Mr. Graffam has no severe stenoses present.  He should do well with  continued medical therapy.      [name]. Excell Seltzer, MD  Electronically Signed     MDC/MEDQ  D:  06/21/2007  T:  06/21/2007  Job:  [id number]   cc:   Karie Schwalbe, MD

## 2010-07-29 NOTE — Assessment & Plan Note (Signed)
[facility]                            CARDIOLOGY OFFICE NOTE   Harel, Repetto ZEBULIN SIEGEL                    MRN:          [id number]  DATE:01/27/2008                            DOB:          03-04-1941    Kennedy Bucker returns for followup with [facility] Cardiology Office on  January 27, 2008.  He is a 70 year old gentleman with coronary artery  disease and prior myocardial infarction and his last visit here in  April, he complained of angina with emotional stress.  He underwent  cardiac catheterization that demonstrated nonobstructive CAD.  He had  diffuse plaque but no areas of high-grade stenosis.  He continues to  have right-sided chest pain with emotional stress.  He has no symptoms  with physical exertion.  He is able to do heavy yard work without  problems.  He specifically denies dyspnea, orthopnea, PND, edema,  palpitations, lightheadedness, or syncope.  He and his wife are raising  their teenage grandchildren, there are 3 teenage grandchildren and there  is obviously lot of stress involved with that.  He is not engaged in  regular exercise.   MEDICATIONS:  1. Atacand HCT 32/12.5 mg daily.  2. Aspirin 325 mg daily.  3. Omeprazole 20 mg b.i.d.  4. Toprol-XL 25 mg daily.  5. Zetia 10 mg daily.  6. Colchicine 0.6 mg daily.   ALLERGIES:  STATINS, he has tried them all and has been intolerant.   PHYSICAL EXAMINATION:  GENERAL:  Alert and oriented, in no acute  distress.  VITAL SIGNS:  Weight 235 pounds, blood pressure 114/70, heart rate 66,  respiratory rate 16.  HEENT:  Normal.  NECK:  Normal carotid upstrokes.  No bruits.  JVP normal.  LUNGS:  Clear bilaterally.  HEART:  Regular rate and rhythm.  No murmurs or gallops.  ABDOMEN:  Soft, nontender, no organomegaly.  EXTREMITIES:  No clubbing, cyanosis, or edema.  SKIN:  Warm and dry without rash.   EKG shows normal sinus rhythm, rate is within normal limits.   Lipids June 15, 2007  showed a cholesterol of 163, LDL 105, HDL 33,  triglycerides 129.   ASSESSMENT:  1. Coronary artery disease with prior myocardial infarction.  Left      ventricular function was normal with an ejection fraction of 65% by      left ventricular angiography.  Nonobstructive coronary artery      disease as noted.  Continue medical therapy.  I am not sure of the      etiology of his right-sided chest pain.  It certainly sounds like      angina as it occurs with emotional stress.  I am going to give him      a therapeutic trial of Imdur 30 mg daily to see if this helps.  I      have encouraged that he is able to do heavy physical work with no      chest discomfort.  He should continue on his beta-blocker, aspirin      and lipid lowering therapy as outlined above.  2. Dyslipidemia.  He is intolerant to  statins.  Continue Zetia 10 mg.  3. Hypertension.  Blood pressure is under good control.  He was given      a 90-day prescription for Atacand HCT for his malodor.   For followup, I would like to see Mr. Rosencrans back in 6 months.     [name]. Excell Seltzer, MD  Electronically Signed    MDC/MedQ  DD: 01/27/2008  DT: 01/28/2008  Job #: [id number]   cc:   Karie Schwalbe, MD

## 2010-07-29 NOTE — Assessment & Plan Note (Signed)
[facility]                            CARDIOLOGY OFFICE NOTE   Jonathan, Haskell LINKEN Glass                    MRN:          [id number]  DATE:06/15/2007                            DOB:          [birth year]    Jonathan Glass returns for followup at the [facility] cardiology office on  June 15, 2007.  Jonathan Glass is a very nice 70 year old gentleman with  coronary artery disease and previous PCI back in the mid-90s, when he  presented with an acute myocardial infarction.  Jonathan Glass was last  seen 1 year ago, and at that time he denied any angina.  He has had  chronic exertional dyspnea.  His last Myoview stress study was in 2006,  and it demonstrated minimal ischemia, with either diaphragmatic  attenuation verses a small degree of inferior wall scar that was  essentially a low risk study, and he has been managed medically.  His LV  ejection fraction by his gated scan was 53%.   Jonathan Glass has developed more frequent chest discomfort.  With  emotional stress, he describes substernal chest pain radiating to the  jaw.  He has not had frequent symptoms, but this has occurred on a few  separate occasions and is clearly brought on by either physical or  emotional stress.  He denies nausea, vomiting, diaphoresis,  lightheadedness or syncope.  He has no claudication symptoms.  His  exertional dyspnea is unchanged over time.  He has not taken sublingual  nitroglycerin.  His episodes of chest discomfort and jaw pain have been  self-limited and have resolved after sitting down and resting.   CURRENT MEDICATIONS:  Include:  1. Atacand HCT 32/12.5 mg daily.  2. Aspirin 325 mg daily.  3. Omeprazole 20 mg twice daily.  4. Flonase 2 sprays daily.  5. Toprol XL 25 mg daily.  6. Zetia 10 mg daily.  7. Colchicine 0.6 mg daily.   ALLERGIES:  Include multiple statins which cause myalgias.   EXAMINATION:  GENERAL:  On exam, the patient is alert and oriented.  He  is in  no acute distress.  Weight is 241, blood pressure 118/60, heart  rate 60, respiratory rate 16.  HEENT:  Normal.  NECK:  Normal carotid upstrokes without bruits.  Jugular venous pressure  is normal.  LUNGS:  Clear bilaterally.  HEART:  Regular rate and rhythm without murmurs or gallops.  ABDOMEN:  Soft, nontender, no organomegaly, no bruits.  EXTREMITIES:  No clubbing, cyanosis or edema.  Peripheral pulses are 2+  and equal throughout.   EKG shows sinus rhythm with a heart rate of 60 beats per minute.  There  are no ST-segment or T-wave changes.   ASSESSMENT:  1. Coronary artery disease with increased angina.  I am concerned      about Mr. Caso symptoms, even though they do not appear to      have a crescendo pattern.  However, he is having symptoms very      worrisome for angina, and they seem to be brought on by stress.  He      had a Myoview  scan approximately 2 years ago, and our options would      be to repeat a non-invasive study versus proceeding with cardiac      cath.  I have reviewed the options in detail with him, and my      recommendation was to go straight to diagnostic cath in this      gentleman with known CAD and concerning features by history.  He is      agreeable with this approach.  Risks and indications were reviewed      in detail.  He will continue on his current medical therapy without      changes, as he has good blood pressure and heart rate control at      present.  If he has progressive symptoms, he was advised to contact      EMS.  We will plan on a diagnostic catheterization in the [facility] cath      lab.  2. Dyslipidemia.  He is Statin intolerant.  Continue Zetia 10 mg      daily.  3. Hypertension, managed by Dr. Alphonsus Sias.  Blood pressure in the ideal      range on a combination of Atacand/hydrochlorothiazide and Toprol      XL.   Followup to be determined, after cardiac cath results available.     [name]. Excell Seltzer, MD  Electronically Signed     MDC/MedQ  DD: 06/20/2007  DT: 06/20/2007  Job #: [id number]   cc:   Karie Schwalbe, MD

## 2010-08-01 NOTE — Assessment & Plan Note (Signed)
[facility]                            CARDIOLOGY OFFICE NOTE   Jonathan Glass, Jonathan Glass                    MRN:          [id number]  DATE:06/25/2006                            DOB:          12/04/1940    Jonathan Glass was seen as an outpatient at the [facility] Cardiology  Clinic on June 25, 2006.  He is a 70 year old gentleman with coronary  artery disease who is status post PCI in 1997 for primary treatment of a  myocardial infarction.  He has done very well in the interim.  He has  had stable exertional angina and has not required further  catheterization procedures.  His most recent nuclear study was in  November of 2006 and this showed preserved left ventricular ejection  fraction of 53%.  There was a question of diaphragmatic attenuation  versus a small degree of inferior wall scarring.   Jonathan Glass has no complaints today.  He has had no angina.  He has had  no edema, light-headedness, syncope, or claudication symptoms.  He has  stable exertional dyspnea when walking up a hill.   CURRENT MEDICATIONS:  1. Atacand/hydrochlorothiazide 32/12.5 mg daily.  2. Aspirin 325 mg daily.  3. Omeprazole 20 mg b.i.d.  4. Flonase daily.  5. Advicor 50/200 mg daily.  6. Toprol XL 25 mg daily.  7. Zetia 10 mg daily.   EXAM:  The patient is alert and oriented, in no acute distress.  His weight is 241 pounds.  This is up 5 pounds from his visit in October  of 2007.  Blood pressure is 123/72, heart rate 61, respiratory rate 16.  HEENT:  Normal.  NECK:  Normal carotid upstrokes without bruits.  Jugular venous pressure  is normal.  LUNGS:  Clear to auscultation bilaterally.  HEART:  Regular rate and rhythm without murmurs or gallops.  ABDOMEN:  Soft, obese, and nontender.  No organomegaly.  EXTREMITIES:  No cyanosis, clubbing, or edema.  Peripheral pulses are 2+  and equal throughout.   ELECTROCARDIOGRAM:  Normal sinus rhythm and is within normal  limits.   Most recent lipids after Zetia was initiated show a cholesterol of 155,  triglycerides of 62, HDL of 43, and LDL of 100.   ASSESSMENT:  1. Coronary artery disease.  Jonathan Glass is stable.  He has had no      angina.  His blood pressure is under ideal control, as is his heart      rate.  We will continue his current medical therapy.  2. Dyslipidemia.  He has been intolerant to multiple statins and has      been tried on Lipitor, Crestor, and Pravachol.  We started him on      Zetia last year and he is doing well on this medication.  Will      continue with yearly lipids and LFTs.   FOLLOWUP:  Since Jonathan Glass remains stable from a cardiac standpoint, I  will plan on seeing him on a yearly basis.  I would be happy to see him  if he has any problems in the interim, but I  suspect he will continue to  do well.     [name]. Excell Seltzer, MD  Electronically Signed    MDC/MedQ  DD: 06/25/2006  DT: 06/25/2006  Job #: [id number]   cc:   Karie Schwalbe, MD

## 2010-08-01 NOTE — Assessment & Plan Note (Signed)
[facility]                              CARDIOLOGY OFFICE NOTE   Edwards, Mckelvie ROME ECHAVARRIA                    MRN:          [id number]  DATE:12/21/2005                            DOB:          1940/09/07    Jonathan Glass was seen this morning at the [facility] clinic as an  outpatient.  He is a very pleasant 70 year old man with known coronary  artery disease who is status post percutaneous coronary intervention back in  1997 for primary treatment of a myocardial infarction.  In the interim he  has done quite well from a cardiovascular standpoint.  He has been  maintained on medical therapy and his biggest difficulty has been inability  to tolerate any statin medicines.  He has been on multiple medications  including Crestor, Lipitor and Pravachol.  He has had significant muscle  pains with all of these medicines.  Most recently he has been on Crestor at  a very low dose 2.5 mg daily and was unable to tolerate this.  He had severe  calf cramping on this medication.  He discontinued it one month ago and his  symptoms have resolved.   Mr. Goldner reports occasional chest pain that only happens once every one  to months.  It occurs with periods of high anxiety or stress.  He has chest  pressure radiating to the jaw when this occurs.  He has taken nitroglycerin  on three occasions in the past 12 months.  He has had symptom resolution  with sublingual nitroglycerin.  He also reports dyspnea with walking up  hills.  He has no symptoms when walking on a flat grade.  With walking up a  hill he reports no chest pressure or other symptoms.  He denies  lightheadedness, diaphoresis and syncope, palpitations, orthopnea or  paroxysmal nocturnal dyspnea.   CURRENT MEDICATIONS:  1. Atacand HCT 30/12.5 mg daily.  2. Aspirin 325 mg daily.  3. Omeprazole 20 mg twice daily.  4. Flonase daily.  5. Advicor 500/20 mg as needed.  6. Nitroglycerin sublingual  as needed.   PHYSICAL EXAMINATION:  GENERAL:  He is alert and oriented, in no acute  distress.  VITAL SIGNS:  Weight is 236 pounds, blood pressure is 122/69, heart rate 71,  respiratory rate 12.  HEENT:  Sclerae anicteric, conjunctivae pink, moist oral mucosa.  NECK:  Normal carotid upstrokes without bruits.  Jugular venous pressure  normal.  LUNGS:  Clear to auscultation bilaterally.  CARDIOVASCULAR:  At apex, discrete, nondisplaced.  Heart has regular rate  and rhythm without murmurs or gallops.  ABDOMEN:  Soft, nontender, no organomegaly, no abdominal bruits.  EXTREMITIES:  No cyanosis, clubbing or edema, peripheral pulses were 2+ and  equal throughout.   EKG demonstrates normal sinus rhythm and is within normal limits.  There is  no evidence of prior myocardial infarction.  There is a single PVC present.   Adenosine Myoview stress test from January 30, 2005 demonstrated  diaphragmatic attenuation versus a small degree of inferior wall scarring  with minimal ischemia.  His ejection fraction was normal at 53%.  ASSESSMENT:  Mr. Donahoe is a 70 year old male with the following  cardiovascular issues.  1. Coronary artery disease.  He is status post PCI in 1997.  He has rare      angina.  I think at this point he can be managed medically as his      symptoms are infrequent and nitro responsive.  He has had no escalation      of symptoms over the past year.  I added Toprol XL 25 mg to his medical      regimen.  He will continue to take Atacand HCT and aspirin.  If he      develops any increase in symptoms, I asked him to contact me      immediately and we should consider cardiac catheterization at that      point.  2. Dyslipidemia.  I do not have a copy of his lipids but he has failed      multiple therapy secondary to medication intolerance.  He has not been      on ezetimibe and I have written him a prescription for a 10 mg daily      dose.  I told him that this worked through a  different mechanism and      should not cause significant muscle cramps.  We will check a follow-up      lipid panel and liver function tests in three months.  I have requested      his lipid studies from Dr. Karle Starch office for baseline.   We will plan on seeing Mr. Coots back in six months or sooner if any new  problems arise.       [name]. Excell Seltzer, MD     MDC/MedQ  DD:  12/21/2005  DT:  12/22/2005  Job #:  [id number]   cc:   Karie Schwalbe, MD  Clydene Fake, M.D.

## 2010-08-01 NOTE — Op Note (Signed)
NAME:  Jonathan Glass, Jonathan Glass NO.:  [id number]   MEDICAL RECORD NO.:  [id number]          PATIENT TYPE:  INP   LOCATION:  X001                         FACILITY:  [facility]   PHYSICIAN:  Georges Lynch. Gioffre, M.D.DATE OF BIRTH:  [birth year]   DATE OF PROCEDURE:  07/29/2006  DATE OF DISCHARGE:                               OPERATIVE REPORT   ASSISTANT:  Jene Every, M.D.   PREOPERATIVE DIAGNOSES:  1. Complete block with severe spinal stenosis at L3-L4.  2. Recurrent spinal stenosis at L4-5.  Note a little history, he had a      previous lumbar decompressive lumbar laminectomy in [place]      several years ago at L4-5 and L5-S1.   POSTOPERATIVE DIAGNOSES:  1. Complete block with severe spinal stenosis at L3-L4.  2. Recurrent spinal stenosis at L4-5.  Note a little history, he had a      previous lumbar decompressive lumbar laminectomy in [place]      several years ago at L4-5 and L5-S1.   OPERATION:  1. Complete decompressive lumbar laminectomy at L3-L4.  2. Complete decompressive lumbar laminectomy at L4-L5 for a recurrent      spinal stenosis.   PROCEDURE:  Under general anesthesia, routine orthopedic prep and drape  of the back was carried out.  The patient had 2 grams of IV Ancef preop.  At this time, two needles were placed in the back for localization  purposes and following that we then made an incision over from L2 all  the way down to the L5-S1 region.  Bleeders were identified and  cauterized.  The muscle was stripped from the lamina starting out at L2  and L3 region bilaterally.  We then inserted the self-retaining  retractors.  Another x-ray was taken to verified the L3-L4 position.  Following that, we went down and noted that he had marked amount of scar  tissue where he had his previous surgery at L4-5 and L5-S1.  The  myelogram showed a complete block at 3-4 so we started at 3-4, removed  the spinous process of L3 and part of L2.  We went down  and then  completely decompressed the L3-4 space.  The dura was exposed and it was  noted there was severe thickening of the ligamentum flavum.  This was  gently removed from the dura.  He had a waistlike constriction in the  dura as well just distal to that area.  We did foraminotomies and  cleaned out the lateral recesses.  The microscope was used during the  procedure.  We then went down to L4-5 and he really never had a complete  decompressive lumbar laminectomy there.  He had a portion of the arch  still remaining.  We went down and gently removed that portion of the  arch and dissected off the scar tissue at L4-5.  We noted that the dura  was quite thin.  It was a definite difference in the dura at L4-5 where  he had all the scar and previous surgery as compared to what he had at  the L3-4 level.  I  am not sure if at the time of his previous surgery he  had a dural leak or what the situation was, but it was a definite  difference.  Once we decompressed the area, we elected at that time to  put some DuraGen and some Tisseel in the area just for preventive  measures because of how thin the dura was.  Prior to doing that, we had  Anesthesia do a Valsalva maneuver to make sure there was no leaks.  There were no leaks.  Following that, we then closed the wound in layers  in usual fashion.  Did leave the deep distal and portion of  the deep distal and portion of the deep proximal part of the wounds open  so that we could have a source of drainage.  The main part of the wound  was closed in the usual fashion.  Skin was closed with metal staples.  Sterile Neosporin dressing was applied.  The patient left the operating  room in satisfactory condition.           ______________________________  Georges Lynch Darrelyn Hillock, M.D.     RAG/MEDQ  D:  07/29/2006  T:  07/29/2006  Job:  [id number]   cc:   Windy Fast A. Darrelyn Hillock, M.D.  Fax: [phone]   [name]. Excell Seltzer, MD  [address]   [place], [place] [address]
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
## 2010-08-25 ENCOUNTER — Other Ambulatory Visit: Payer: Self-pay | Admitting: *Deleted

## 2010-08-25 MED ORDER — COLCHICINE 0.6 MG PO TABS: ORAL_TABLET | ORAL | Status: DC

## 2010-08-25 NOTE — Telephone Encounter (Signed)
rx sent to pharmacy by e-script  

## 2010-09-01 ENCOUNTER — Encounter: Payer: Self-pay | Admitting: Internal Medicine

## 2010-10-23 ENCOUNTER — Ambulatory Visit: Payer: Medicare Other | Admitting: Cardiovascular Disease

## 2010-11-03 ENCOUNTER — Ambulatory Visit: Payer: Medicare Other | Admitting: Internal Medicine

## 2010-11-07 ENCOUNTER — Encounter: Payer: Self-pay | Admitting: Internal Medicine

## 2010-11-07 ENCOUNTER — Ambulatory Visit (INDEPENDENT_AMBULATORY_CARE_PROVIDER_SITE_OTHER): Payer: Medicare Other | Admitting: Internal Medicine

## 2010-11-07 DIAGNOSIS — E785 Hyperlipidemia, unspecified: Secondary | ICD-10-CM

## 2010-11-07 DIAGNOSIS — R609 Edema, unspecified: Secondary | ICD-10-CM

## 2010-11-07 DIAGNOSIS — I2510 Atherosclerotic heart disease of native coronary artery without angina pectoris: Secondary | ICD-10-CM

## 2010-11-07 DIAGNOSIS — G5603 Carpal tunnel syndrome, bilateral upper limbs: Secondary | ICD-10-CM

## 2010-11-07 DIAGNOSIS — M109 Gout, unspecified: Secondary | ICD-10-CM

## 2010-11-07 DIAGNOSIS — G5600 Carpal tunnel syndrome, unspecified upper limb: Secondary | ICD-10-CM

## 2010-11-07 DIAGNOSIS — I10 Essential (primary) hypertension: Secondary | ICD-10-CM

## 2010-11-07 LAB — CBC WITH DIFFERENTIAL/PLATELET
Basophils Relative: 0.5 % (ref 0.0–3.0)
Eosinophils Absolute: 0.2 10*3/uL (ref 0.0–0.7)
Eosinophils Relative: 2.6 % (ref 0.0–5.0)
HCT: 42.7 % (ref 39.0–52.0)
Hemoglobin: 14.3 g/dL (ref 13.0–17.0)
MCHC: 33.4 g/dL (ref 30.0–36.0)
MCV: 88.8 fl (ref 78.0–100.0)
Monocytes Absolute: 0.8 10*3/uL (ref 0.1–1.0)
Neutro Abs: 5.5 10*3/uL (ref 1.4–7.7)
Neutrophils Relative %: 60.8 % (ref 43.0–77.0)
RBC: 4.81 Mil/uL (ref 4.22–5.81)
WBC: 9 10*3/uL (ref 4.5–10.5)

## 2010-11-07 LAB — TSH: TSH: 1.54 u[IU]/mL (ref 0.35–5.50)

## 2010-11-07 LAB — HEPATIC FUNCTION PANEL
ALT: 26 U/L (ref 0–53)
AST: 24 U/L (ref 0–37)
Alkaline Phosphatase: 88 U/L (ref 39–117)
Bilirubin, Direct: 0 mg/dL (ref 0.0–0.3)
Total Bilirubin: 0.7 mg/dL (ref 0.3–1.2)

## 2010-11-07 LAB — BASIC METABOLIC PANEL
CO2: 28 mEq/L (ref 19–32)
Chloride: 102 mEq/L (ref 96–112)
Creatinine, Ser: 1.4 mg/dL (ref 0.4–1.5)
Potassium: 4.7 mEq/L (ref 3.5–5.1)
Sodium: 138 mEq/L (ref 135–145)

## 2010-11-07 LAB — LIPID PANEL
LDL Cholesterol: 109 mg/dL — ABNORMAL HIGH (ref 0–99)
Total CHOL/HDL Ratio: 3
Triglycerides: 93 mg/dL (ref 0.0–149.0)

## 2010-11-07 LAB — URIC ACID: Uric Acid, Serum: 5.2 mg/dL (ref 4.0–7.8)

## 2010-11-07 MED ORDER — FUROSEMIDE 40 MG PO TABS: 40.0000 mg | ORAL_TABLET | Freq: Two times a day (BID) | ORAL | Status: DC

## 2010-11-07 NOTE — Assessment & Plan Note (Signed)
Intolerant of statins  will check labs on zetia

## 2010-11-07 NOTE — Assessment & Plan Note (Signed)
BP Readings from Last 3 Encounters:  11/07/10 123/56  07/02/10 140/70  05/13/10 134/63   Good control No changes Due for labs

## 2010-11-07 NOTE — Assessment & Plan Note (Signed)
Worse, esp with the prednisone Will increase furosemide to bid unless weight down

## 2010-11-07 NOTE — Assessment & Plan Note (Signed)
Seems to be quiet No angina Stable DOE but no clear cut CHF

## 2010-11-07 NOTE — Assessment & Plan Note (Signed)
Needs ongoing Rx

## 2010-11-07 NOTE — Patient Instructions (Signed)
Please increase the furosemide to twice a day (like around breakfast and lunch) unless your weight is under 240#

## 2010-11-07 NOTE — Assessment & Plan Note (Signed)
improved
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
## 2010-12-01 ENCOUNTER — Telehealth: Payer: Self-pay | Admitting: Cardiovascular Disease

## 2010-12-01 ENCOUNTER — Ambulatory Visit: Payer: Medicare Other | Admitting: Cardiovascular Disease

## 2010-12-09 NOTE — Telephone Encounter (Signed)
Error

## 2010-12-23 ENCOUNTER — Other Ambulatory Visit: Payer: Self-pay | Admitting: *Deleted

## 2010-12-23 NOTE — Telephone Encounter (Signed)
Form on your desk  

## 2010-12-24 MED ORDER — DIAZEPAM 2 MG PO TABS: 2.0000 mg | ORAL_TABLET | Freq: Three times a day (TID) | ORAL | Status: DC | PRN

## 2010-12-24 NOTE — Telephone Encounter (Signed)
rx faxed to pharmacy manually  

## 2010-12-24 NOTE — Telephone Encounter (Signed)
Okay #90 x 0 

## 2010-12-31 ENCOUNTER — Telehealth: Payer: Self-pay

## 2010-12-31 ENCOUNTER — Encounter: Payer: Self-pay | Admitting: Cardiovascular Disease

## 2010-12-31 ENCOUNTER — Ambulatory Visit (INDEPENDENT_AMBULATORY_CARE_PROVIDER_SITE_OTHER): Payer: Medicare Other | Admitting: Cardiovascular Disease

## 2010-12-31 DIAGNOSIS — R609 Edema, unspecified: Secondary | ICD-10-CM

## 2010-12-31 DIAGNOSIS — I2510 Atherosclerotic heart disease of native coronary artery without angina pectoris: Secondary | ICD-10-CM

## 2010-12-31 DIAGNOSIS — I10 Essential (primary) hypertension: Secondary | ICD-10-CM

## 2010-12-31 DIAGNOSIS — E785 Hyperlipidemia, unspecified: Secondary | ICD-10-CM
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
## 2010-12-31 NOTE — Assessment & Plan Note (Signed)
The patient is stable without angina. I reviewed his cardiac catheterization findings from 2009 and he has nonobstructive disease. He will continue with his current medical program. Note the patient is statin intolerant.

## 2010-12-31 NOTE — Patient Instructions (Signed)
Your physician wants you to follow-up in: 1 YEAR.  You will receive a reminder letter in the mail two months in advance. If you don't receive a letter, please call our office to schedule the follow-up appointment.  Your physician recommends that you continue on your current medications as directed. Please refer to the Current Medication list given to you today.  Please contact Dr Karle Starch office about having a BMP drawn in 1 MONTH (Dr Excell Seltzer will send Dr Alphonsus Sias a message about you needing a BMP followed in his office.)

## 2010-12-31 NOTE — Assessment & Plan Note (Signed)
The patient is on ezetimide. His lipids are reasonably well controlled with most recent LDL cholesterol of 109.

## 2010-12-31 NOTE — Telephone Encounter (Signed)
After the pt's appointment today Dr Excell Seltzer recommended that the pt STOP Amlodipine and START Losartan100mg  one by mouth daily.  This change was recommended due to swelling in legs.  I left a message for the pt to call the office to discuss medication change since this was recommended after he had already left the office.

## 2010-12-31 NOTE — Assessment & Plan Note (Signed)
Blood pressure is controlled on a combination of amlodipine, metoprolol, and furosemide. Could consider change of amlodipine to an ACE inhibitor or ARB as this may be contributing to his edema. I will check with him and see if he wants to try this.

## 2011-01-01 MED ORDER — LOSARTAN POTASSIUM 100 MG PO TABS: 100.0000 mg | ORAL_TABLET | Freq: Every day | ORAL | Status: DC

## 2011-01-01 NOTE — Telephone Encounter (Signed)
Pt. Understands to stop Amlodipine and start Losartan.   Prescription called in to [facility]. Mylo Red RN

## 2011-01-01 NOTE — Telephone Encounter (Signed)
Pt returning call to Lauren.Please call back.  

## 2011-05-15 ENCOUNTER — Encounter: Payer: Self-pay | Admitting: Internal Medicine

## 2011-05-15 ENCOUNTER — Ambulatory Visit (INDEPENDENT_AMBULATORY_CARE_PROVIDER_SITE_OTHER): Payer: Medicare Other | Admitting: Internal Medicine

## 2011-05-15 VITALS — BP 138/80 | HR 69 | Temp 97.8°F | Ht 70.0 in | Wt 256.0 lb

## 2011-05-15 DIAGNOSIS — I2510 Atherosclerotic heart disease of native coronary artery without angina pectoris: Secondary | ICD-10-CM

## 2011-05-15 DIAGNOSIS — I10 Essential (primary) hypertension: Secondary | ICD-10-CM

## 2011-05-15 DIAGNOSIS — M069 Rheumatoid arthritis, unspecified: Secondary | ICD-10-CM

## 2011-05-15 DIAGNOSIS — F411 Generalized anxiety disorder: Secondary | ICD-10-CM

## 2011-05-15 DIAGNOSIS — Z0000 Encounter for general adult medical examination without abnormal findings: Secondary | ICD-10-CM | POA: Insufficient documentation

## 2011-05-15 LAB — TSH: TSH: 1.03 u[IU]/mL (ref 0.35–5.50)

## 2011-05-15 LAB — CBC WITH DIFFERENTIAL/PLATELET
Basophils Relative: 0.6 % (ref 0.0–3.0)
Eosinophils Relative: 4.4 % (ref 0.0–5.0)
HCT: 44.6 % (ref 39.0–52.0)
MCV: 89.9 fl (ref 78.0–100.0)
Monocytes Absolute: 0.7 10*3/uL (ref 0.1–1.0)
Monocytes Relative: 9.2 % (ref 3.0–12.0)
Neutrophils Relative %: 60.6 % (ref 43.0–77.0)
RBC: 4.96 Mil/uL (ref 4.22–5.81)
WBC: 8 10*3/uL (ref 4.5–10.5)

## 2011-05-15 LAB — HEPATIC FUNCTION PANEL
ALT: 25 U/L (ref 0–53)
AST: 21 U/L (ref 0–37)
Alkaline Phosphatase: 84 U/L (ref 39–117)
Total Bilirubin: 0.5 mg/dL (ref 0.3–1.2)

## 2011-05-15 LAB — LIPID PANEL
LDL Cholesterol: 120 mg/dL — ABNORMAL HIGH (ref 0–99)
Total CHOL/HDL Ratio: 4
VLDL: 24.2 mg/dL (ref 0.0–40.0)

## 2011-05-15 LAB — BASIC METABOLIC PANEL
Chloride: 106 mEq/L (ref 96–112)
Creatinine, Ser: 1.4 mg/dL (ref 0.4–1.5)
GFR: 55.21 mL/min — ABNORMAL LOW (ref >60.00)
Potassium: 4.6 mEq/L (ref 3.5–5.1)

## 2011-05-15 NOTE — Assessment & Plan Note (Signed)
Uses aleve prn and it seems to help

## 2011-05-15 NOTE — Assessment & Plan Note (Signed)
Lots of caregiver stress now Some degree of anxiety since [place] Hasn't uses any valium in some time

## 2011-05-15 NOTE — Assessment & Plan Note (Signed)
Lots of stress--hard to do healthy behaviors with the caregiving UTD with colon Rx for zostavax

## 2011-05-15 NOTE — Assessment & Plan Note (Signed)
BP Readings from Last 3 Encounters:  05/15/11 138/80  12/31/10 139/76  11/07/10 123/56   Reasonable control

## 2011-05-15 NOTE — Assessment & Plan Note (Signed)
Seems to be quiet Bad cough on the losartan so back on the amlodipine Edema still better now
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
## 2011-07-31 ENCOUNTER — Ambulatory Visit (INDEPENDENT_AMBULATORY_CARE_PROVIDER_SITE_OTHER): Payer: Medicare Other | Admitting: Internal Medicine

## 2011-07-31 ENCOUNTER — Encounter: Payer: Self-pay | Admitting: Internal Medicine

## 2011-07-31 VITALS — BP 130/70 | HR 82 | Temp 97.7°F | Wt 255.0 lb

## 2011-07-31 DIAGNOSIS — M069 Rheumatoid arthritis, unspecified: Secondary | ICD-10-CM

## 2011-07-31 MED ORDER — PREDNISONE 10 MG PO TABS: 40.0000 mg | ORAL_TABLET | Freq: Every day | ORAL | Status: DC

## 2011-07-31 NOTE — Assessment & Plan Note (Signed)
Not a confirmed diagnosis but has changes that suggest this No help with hydrocodone Aleve 2 bid ineffective  Will give prednisone course (helped in past) Due to see Dr Gavin Potters next month
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
## 2011-09-09 ENCOUNTER — Encounter: Payer: Self-pay | Admitting: Family Medicine

## 2011-09-09 ENCOUNTER — Ambulatory Visit (INDEPENDENT_AMBULATORY_CARE_PROVIDER_SITE_OTHER): Payer: Medicare Other | Admitting: Family Medicine

## 2011-09-09 VITALS — BP 128/69 | HR 80 | Temp 98.1°F | Wt 251.5 lb

## 2011-09-09 DIAGNOSIS — R109 Unspecified abdominal pain: Secondary | ICD-10-CM | POA: Insufficient documentation

## 2011-09-09 NOTE — Assessment & Plan Note (Addendum)
States pain feels like prior shingles pain - could be non-rash shingles, however outside of window for antivirals as started 6d ago. Will check LFTs and CBC today to eval intraabd pathology, check abd [place] to eval gallbladder although denies food related and not typical biliary colic.  If worsening or red flags, to seek urgent care.
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
## 2011-09-09 NOTE — Patient Instructions (Addendum)
This could be shingles, or it could be some other abdominal problem - I'd like to check some blood work today and will call you with results. If pain not improving as expected, let us know for abdominal ultrasound.  If worsening, seek urgent care. Start with trying tylenol 500mg  3 times daily for this discomfort.

## 2011-09-10 LAB — COMPREHENSIVE METABOLIC PANEL
ALT: 30 U/L (ref 0–53)
AST: 28 U/L (ref 0–37)
Albumin: 4.1 g/dL (ref 3.5–5.2)
Alkaline Phosphatase: 67 U/L (ref 39–117)
Glucose, Bld: 85 mg/dL (ref 70–99)
Potassium: 4.6 mEq/L (ref 3.5–5.1)
Sodium: 142 mEq/L (ref 135–145)
Total Bilirubin: 0.8 mg/dL (ref 0.3–1.2)
Total Protein: 7.4 g/dL (ref 6.0–8.3)

## 2011-09-10 LAB — CBC WITH DIFFERENTIAL/PLATELET
Basophils Absolute: 0 10*3/uL (ref 0.0–0.1)
Eosinophils Relative: 2.7 % (ref 0.0–5.0)
HCT: 48.5 % (ref 39.0–52.0)
Lymphs Abs: 2.6 10*3/uL (ref 0.7–4.0)
MCV: 91.3 fl (ref 78.0–100.0)
Monocytes Absolute: 0.8 10*3/uL (ref 0.1–1.0)
Monocytes Relative: 8.8 % (ref 3.0–12.0)
Neutrophils Relative %: 58.3 % (ref 43.0–77.0)
Platelets: 188 10*3/uL (ref 150.0–400.0)
RDW: 14.9 % — ABNORMAL HIGH (ref 11.5–14.6)
WBC: 8.7 10*3/uL (ref 4.5–10.5)

## 2011-09-11 ENCOUNTER — Ambulatory Visit
Admission: RE | Admit: 2011-09-11 | Discharge: 2011-09-11 | Disposition: A | Discharge status: Home / Self Care | Payer: Medicare Other | Source: Ambulatory Visit | Attending: Family Medicine | Admitting: Family Medicine

## 2011-09-11 ENCOUNTER — Other Ambulatory Visit: Payer: Self-pay | Admitting: Family Medicine

## 2011-09-11 DIAGNOSIS — R109 Unspecified abdominal pain: Secondary | ICD-10-CM

## 2011-09-11 MED ORDER — HYDROCODONE-ACETAMINOPHEN 5-500 MG PO TABS: 1.0000 | ORAL_TABLET | Freq: Three times a day (TID) | ORAL | Status: DC | PRN

## 2011-09-11 MED ORDER — GABAPENTIN 300 MG PO CAPS: 300.0000 mg | ORAL_CAPSULE | Freq: Two times a day (BID) | ORAL | Status: DC

## 2011-09-21 ENCOUNTER — Telehealth: Payer: Self-pay

## 2011-09-21 NOTE — Telephone Encounter (Signed)
Pt seen 09/09/11 by Dr Reece Agar and [place] of abdomen 09/11/11; pt said rt abdominal pain with burning on and off is no better; pain level 6-7 and made appt with Dr Alphonsus Sias 09/22/11 at 12 noon.

## 2011-09-21 NOTE — Telephone Encounter (Signed)
Okay  Will reevaluate at that time

## 2011-09-22 ENCOUNTER — Ambulatory Visit (INDEPENDENT_AMBULATORY_CARE_PROVIDER_SITE_OTHER): Payer: Medicare Other | Admitting: Internal Medicine

## 2011-09-22 ENCOUNTER — Encounter: Payer: Self-pay | Admitting: Internal Medicine

## 2011-09-22 VITALS — BP 120/70 | HR 78 | Temp 98.5°F | Ht 70.0 in | Wt 251.0 lb

## 2011-09-22 DIAGNOSIS — IMO0002 Reserved for concepts with insufficient information to code with codable children: Secondary | ICD-10-CM

## 2011-09-22 DIAGNOSIS — M5416 Radiculopathy, lumbar region: Secondary | ICD-10-CM

## 2011-09-22 NOTE — Patient Instructions (Signed)
Please try the gabapentin 300mg  at bedtime. If no problems after 2 days, increase to 2 capsules (600mg ) at bedtime. If you are tolerating this, but not having adequate relief, please call after 2 weeks and we will discuss options
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
## 2011-09-22 NOTE — Assessment & Plan Note (Signed)
Along ~L2 spinal nerve distribution Doesn't make sense for this to be shingles when he had this in other dermatome in past Some help with gabapentin--then got scared Will try this again at bedtime

## 2011-09-30 ENCOUNTER — Telehealth: Payer: Self-pay | Admitting: Internal Medicine

## 2011-09-30 NOTE — Telephone Encounter (Signed)
Caller: Gurkirat/Patient; Phone Number: [phone]; Message from caller: He has been on Gabopentin for about a wk and a half 300 mg, he wanted to let MD know he has noticed any difference in the pain.  He said MD had mentioned may need to increase dosage.

## 2011-09-30 NOTE — Telephone Encounter (Signed)
Spoke with patient and he is already taking 600 mg ( 2 tabs of 300 mg) at bedtime, per patient pain is still the same, nothing different.

## 2011-09-30 NOTE — Telephone Encounter (Signed)
If he hasn't already, he should increase to 600mg  at bedtime

## 2011-10-01 NOTE — Telephone Encounter (Signed)
Have him increase to 900mg  (3 capsules) at bedtime That was my plan if the 600 didn't work The maximum daily dose is 3600mg  so we are not even close to that yet (though I don't plan to go that high)

## 2011-10-01 NOTE — Telephone Encounter (Signed)
Spoke with patient and advised results Med list updated, per pt he doesn't need a refill just yet.

## 2011-10-12 ENCOUNTER — Telehealth: Payer: Self-pay | Admitting: Internal Medicine

## 2011-10-12 NOTE — Telephone Encounter (Signed)
Called in Vicodin 5-500 1 PO Q 8 hours PRN #[id number] with 0 refills to [facility]. Patient notified.

## 2011-10-12 NOTE — Telephone Encounter (Signed)
Okay to refill hydrocodone #60 x 0

## 2011-10-12 NOTE — Telephone Encounter (Signed)
Caller: Jonathan Glass/Patient; PCP: Tillman Abide; CB#: [phone]. Call regarding Side Pain Persists, Out of Pain Meds/Neurontin after medication was increased to 900mg  at bedtime. Caller reports his pain was improved with meds until Sunday 7/28 when he began to clean house/use vacumn. He is taking 1 Vicodin/day and has 1 pill left. RN reviewed notes in EPIC re recent visit/calls. MD noted for him to callback prn. Caller advised a note will be sent for MD. Pharmacy is [facility]. Caller is agreeable and can be reached at above #.

## 2011-10-15 ENCOUNTER — Other Ambulatory Visit: Payer: Self-pay

## 2011-10-15 MED ORDER — GABAPENTIN 300 MG PO CAPS: 900.0000 mg | ORAL_CAPSULE | Freq: Every day | ORAL | Status: DC

## 2011-10-15 NOTE — Telephone Encounter (Signed)
Pharmacy requesting new RX l for Gabapentin #300 3 times daily if it is correct. Form in your basket.

## 2011-10-15 NOTE — Telephone Encounter (Signed)
Gabapentin is now 3 tabs at bedtime Okay #90 x 5

## 2011-10-19 ENCOUNTER — Telehealth: Payer: Self-pay

## 2011-10-19 ENCOUNTER — Other Ambulatory Visit: Payer: Self-pay | Admitting: *Deleted

## 2011-10-19 MED ORDER — COLCHICINE 0.6 MG PO TABS: ORAL_TABLET | ORAL | Status: DC

## 2011-10-19 NOTE — Telephone Encounter (Signed)
Pt left v/m gabapentin is not helping; tried since the 26th. Called left v/m for pt to call back to get add'l info.

## 2011-10-22 ENCOUNTER — Telehealth: Payer: Self-pay

## 2011-10-22 MED ORDER — GABAPENTIN 300 MG PO CAPS: ORAL_CAPSULE | ORAL | Status: DC

## 2011-10-22 NOTE — Telephone Encounter (Signed)
Opened in error

## 2011-10-22 NOTE — Telephone Encounter (Signed)
Have him try 300mg  in AM and 900mg  at night Okay to increase Rx to #120 per month 1 refill

## 2011-10-22 NOTE — Telephone Encounter (Signed)
Pt still having pain and is presently taking gabapentin 900 mg at hs. Pt had discussed with Dr Alphonsus Sias; can gabapentin be increased? [place].Please advise.

## 2011-10-22 NOTE — Telephone Encounter (Signed)
Spoke with patient and advised results rx sent to pharmacy by e-script  

## 2011-11-19 ENCOUNTER — Other Ambulatory Visit: Payer: Self-pay | Admitting: *Deleted

## 2011-11-19 NOTE — Telephone Encounter (Signed)
Okay #30 x 0 

## 2011-11-19 NOTE — Telephone Encounter (Signed)
Last filled 10/12/2011

## 2011-11-20 MED ORDER — HYDROCODONE-ACETAMINOPHEN 5-500 MG PO TABS: 1.0000 | ORAL_TABLET | Freq: Three times a day (TID) | ORAL | Status: DC | PRN

## 2011-11-20 NOTE — Telephone Encounter (Signed)
rx called into pharmacy

## 2011-11-27 ENCOUNTER — Other Ambulatory Visit: Payer: Self-pay | Admitting: *Deleted

## 2011-11-27 MED ORDER — COLCHICINE 0.6 MG PO TABS: ORAL_TABLET | ORAL | Status: DC

## 2011-11-27 NOTE — Telephone Encounter (Signed)
Okay #90 x 5 

## 2011-12-01 ENCOUNTER — Encounter: Payer: Self-pay | Admitting: Internal Medicine

## 2011-12-01 ENCOUNTER — Ambulatory Visit (INDEPENDENT_AMBULATORY_CARE_PROVIDER_SITE_OTHER): Payer: Medicare Other | Admitting: Internal Medicine

## 2011-12-01 VITALS — BP 128/60 | HR 71 | Temp 98.0°F | Ht 70.0 in | Wt 253.0 lb

## 2011-12-01 DIAGNOSIS — IMO0002 Reserved for concepts with insufficient information to code with codable children: Secondary | ICD-10-CM

## 2011-12-01 DIAGNOSIS — G5600 Carpal tunnel syndrome, unspecified upper limb: Secondary | ICD-10-CM

## 2011-12-01 DIAGNOSIS — M5416 Radiculopathy, lumbar region: Secondary | ICD-10-CM

## 2011-12-01 DIAGNOSIS — M109 Gout, unspecified: Secondary | ICD-10-CM

## 2011-12-01 DIAGNOSIS — Z23 Encounter for immunization: Secondary | ICD-10-CM

## 2011-12-01 DIAGNOSIS — G5603 Carpal tunnel syndrome, bilateral upper limbs: Secondary | ICD-10-CM

## 2011-12-01 DIAGNOSIS — I10 Essential (primary) hypertension: Secondary | ICD-10-CM

## 2011-12-01 NOTE — Assessment & Plan Note (Signed)
Hand pain is better since injections Not clear if he really has RA

## 2011-12-01 NOTE — Assessment & Plan Note (Signed)
Fairly quiet on allopurinol and 1 colchicine daily Increases colchicine prn
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
## 2011-12-01 NOTE — Assessment & Plan Note (Signed)
Better with gabapentin Will continue

## 2011-12-01 NOTE — Assessment & Plan Note (Signed)
BP Readings from Last 3 Encounters:  12/01/11 128/60  09/22/11 120/70  09/09/11 128/69   Good control No changes needed

## 2011-12-18 ENCOUNTER — Other Ambulatory Visit: Payer: Self-pay | Admitting: *Deleted

## 2011-12-18 MED ORDER — GABAPENTIN 300 MG PO CAPS: ORAL_CAPSULE | ORAL | Status: DC

## 2011-12-18 MED ORDER — FUROSEMIDE 40 MG PO TABS: 40.0000 mg | ORAL_TABLET | Freq: Two times a day (BID) | ORAL | Status: DC

## 2011-12-18 NOTE — Telephone Encounter (Signed)
Faxed request also for DIAZEPAM 2mg , take 1-2 tablets by mouth three times daily prn for dizziness of MENIERE'S DISEASE, last filled 12/24/2010.

## 2011-12-19 NOTE — Telephone Encounter (Signed)
Okay to fill #90 x 0 

## 2011-12-21 MED ORDER — DIAZEPAM 2 MG PO TABS: 2.0000 mg | ORAL_TABLET | Freq: Three times a day (TID) | ORAL | Status: DC | PRN

## 2011-12-21 NOTE — Telephone Encounter (Signed)
rx called into pharmacy

## 2012-01-06 ENCOUNTER — Ambulatory Visit: Payer: Medicare Other | Admitting: Cardiovascular Disease

## 2012-01-29 ENCOUNTER — Other Ambulatory Visit: Payer: Self-pay | Admitting: *Deleted

## 2012-01-29 MED ORDER — HYDROCODONE-ACETAMINOPHEN 5-500 MG PO TABS: 1.0000 | ORAL_TABLET | Freq: Three times a day (TID) | ORAL | Status: DC | PRN

## 2012-01-29 NOTE — Telephone Encounter (Signed)
rx called into pharmacy

## 2012-01-29 NOTE — Telephone Encounter (Signed)
Okay #30 x 0 

## 2012-02-15 ENCOUNTER — Encounter: Payer: Self-pay | Admitting: Cardiovascular Disease

## 2012-02-15 ENCOUNTER — Ambulatory Visit (INDEPENDENT_AMBULATORY_CARE_PROVIDER_SITE_OTHER): Payer: Medicare Other | Admitting: Cardiovascular Disease

## 2012-02-15 VITALS — BP 148/76 | HR 68 | Ht 70.0 in | Wt 252.4 lb

## 2012-02-15 DIAGNOSIS — I10 Essential (primary) hypertension: Secondary | ICD-10-CM
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
## 2012-02-15 NOTE — Patient Instructions (Addendum)
**Note De-identified  Obfuscation** Your physician recommends that you continue on your current medications as directed. Please refer to the Current Medication list given to you today.  Your physician wants you to follow-up in: 1 year. You will receive a reminder letter in the mail two months in advance. If you don't receive a letter, please call our office to schedule the follow-up appointment.  

## 2012-02-29 ENCOUNTER — Ambulatory Visit (INDEPENDENT_AMBULATORY_CARE_PROVIDER_SITE_OTHER): Payer: Medicare Other | Admitting: Internal Medicine

## 2012-02-29 ENCOUNTER — Encounter: Payer: Self-pay | Admitting: Internal Medicine

## 2012-02-29 VITALS — BP 130/78 | HR 77 | Temp 98.1°F | Wt 257.0 lb

## 2012-02-29 DIAGNOSIS — G5790 Unspecified mononeuropathy of unspecified lower limb: Secondary | ICD-10-CM

## 2012-02-29 DIAGNOSIS — M792 Neuralgia and neuritis, unspecified: Secondary | ICD-10-CM | POA: Insufficient documentation

## 2012-02-29 NOTE — Assessment & Plan Note (Addendum)
Doesn't seem to involve the intraperitoneal organs---just on surface Will try increasing the gabapentin----[facility] 400mg  tabs so will increase to that  May want to consider change of med, adding small dose of amitriptylline, neuro consult

## 2012-02-29 NOTE — Patient Instructions (Signed)
Please change over the 300mg  gabapentin to the 400mg  dose---1 each day Call in about 2 weeks if you are not getting any better
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
## 2012-04-27 ENCOUNTER — Encounter: Payer: Self-pay | Admitting: Internal Medicine

## 2012-04-27 ENCOUNTER — Ambulatory Visit (INDEPENDENT_AMBULATORY_CARE_PROVIDER_SITE_OTHER): Payer: Medicare Other | Admitting: Internal Medicine

## 2012-04-27 VITALS — BP 130/70 | HR 90 | Temp 98.0°F | Wt 257.0 lb

## 2012-04-27 DIAGNOSIS — J209 Acute bronchitis, unspecified: Secondary | ICD-10-CM | POA: Insufficient documentation

## 2012-04-27 MED ORDER — AMOXICILLIN 500 MG PO TABS: 1000.0000 mg | ORAL_TABLET | Freq: Two times a day (BID) | ORAL | Status: DC

## 2012-04-27 NOTE — Assessment & Plan Note (Signed)
Sick for 3 weeks Time course suggests secondary bacterial infection Will treat with amoxil and broaden coverage if persistent infection
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
## 2012-06-21 ENCOUNTER — Encounter: Payer: Self-pay | Admitting: Internal Medicine

## 2012-06-21 ENCOUNTER — Ambulatory Visit (INDEPENDENT_AMBULATORY_CARE_PROVIDER_SITE_OTHER): Payer: Medicare Other | Admitting: Internal Medicine

## 2012-06-21 VITALS — BP 138/80 | HR 61 | Temp 98.0°F | Ht 70.0 in | Wt 254.0 lb

## 2012-06-21 DIAGNOSIS — M109 Gout, unspecified: Secondary | ICD-10-CM

## 2012-06-21 DIAGNOSIS — M792 Neuralgia and neuritis, unspecified: Secondary | ICD-10-CM

## 2012-06-21 DIAGNOSIS — E785 Hyperlipidemia, unspecified: Secondary | ICD-10-CM

## 2012-06-21 DIAGNOSIS — G5790 Unspecified mononeuropathy of unspecified lower limb: Secondary | ICD-10-CM

## 2012-06-21 DIAGNOSIS — Z23 Encounter for immunization: Secondary | ICD-10-CM

## 2012-06-21 DIAGNOSIS — H8109 Meniere's disease, unspecified ear: Secondary | ICD-10-CM

## 2012-06-21 DIAGNOSIS — I10 Essential (primary) hypertension: Secondary | ICD-10-CM

## 2012-06-21 DIAGNOSIS — Z0000 Encounter for general adult medical examination without abnormal findings: Secondary | ICD-10-CM

## 2012-06-21 LAB — HEPATIC FUNCTION PANEL
ALT: 21 U/L (ref 0–53)
AST: 22 U/L (ref 0–37)
Albumin: 3.6 g/dL (ref 3.5–5.2)
Alkaline Phosphatase: 89 U/L (ref 39–117)
Bilirubin, Direct: 0.1 mg/dL (ref 0.0–0.3)
Total Protein: 7.2 g/dL (ref 6.0–8.3)

## 2012-06-21 LAB — CBC WITH DIFFERENTIAL/PLATELET
Basophils Relative: 0.6 % (ref 0.0–3.0)
Eosinophils Relative: 4.1 % (ref 0.0–5.0)
HCT: 44.2 % (ref 39.0–52.0)
Hemoglobin: 15.1 g/dL (ref 13.0–17.0)
Lymphs Abs: 2.3 10*3/uL (ref 0.7–4.0)
MCV: 87.8 fl (ref 78.0–100.0)
Monocytes Absolute: 1 10*3/uL (ref 0.1–1.0)
Monocytes Relative: 10 % (ref 3.0–12.0)
Neutro Abs: 6 10*3/uL (ref 1.4–7.7)
Platelets: 236 10*3/uL (ref 150.0–400.0)
RBC: 5.03 Mil/uL (ref 4.22–5.81)
WBC: 9.8 10*3/uL (ref 4.5–10.5)

## 2012-06-21 LAB — BASIC METABOLIC PANEL
BUN: 19 mg/dL (ref 6–23)
Chloride: 104 mEq/L (ref 96–112)
Potassium: 4.1 mEq/L (ref 3.5–5.1)
Sodium: 138 mEq/L (ref 135–145)

## 2012-06-21 LAB — LIPID PANEL
Cholesterol: 168 mg/dL (ref 0–200)
HDL: 37.4 mg/dL — ABNORMAL LOW (ref >39.00)
VLDL: 21.6 mg/dL (ref 0.0–40.0)

## 2012-06-21 MED ORDER — MECLIZINE HCL 25 MG PO TABS: 25.0000 mg | ORAL_TABLET | Freq: Three times a day (TID) | ORAL | Status: DC | PRN

## 2012-06-21 MED ORDER — DIAZEPAM 2 MG PO TABS: 2.0000 mg | ORAL_TABLET | Freq: Three times a day (TID) | ORAL | Status: DC | PRN

## 2012-06-21 MED ORDER — SILDENAFIL CITRATE 100 MG PO TABS: 50.0000 mg | ORAL_TABLET | Freq: Every day | ORAL | Status: DC | PRN

## 2012-06-21 NOTE — Assessment & Plan Note (Addendum)
Needs to work on fitness Pneumovax update today Rx for zostavax No PSA due to age  viagra Rx for ED----doesn't take ntg

## 2012-06-21 NOTE — Addendum Note (Signed)
Addended by: Sueanne Margarita on: 06/21/2012 08:47 AM   Modules accepted: Orders

## 2012-06-21 NOTE — Assessment & Plan Note (Signed)
Still present but better with increased gabapentin

## 2012-06-21 NOTE — Assessment & Plan Note (Signed)
Uses diazepam prn Promethazine in the past---will give meclizine Rx and go back to this if not effective

## 2012-06-21 NOTE — Patient Instructions (Signed)

## 2012-06-21 NOTE — Assessment & Plan Note (Signed)
BP Readings from Last 3 Encounters:  06/21/12 138/80  04/27/12 130/70  02/29/12 130/78   Acceptable control No changes
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
## 2012-07-11 ENCOUNTER — Telehealth: Payer: Self-pay | Admitting: Internal Medicine

## 2012-07-11 ENCOUNTER — Ambulatory Visit (INDEPENDENT_AMBULATORY_CARE_PROVIDER_SITE_OTHER): Payer: Medicare Other | Admitting: Internal Medicine

## 2012-07-11 ENCOUNTER — Encounter: Payer: Self-pay | Admitting: Internal Medicine

## 2012-07-11 VITALS — BP 150/70 | HR 78 | Wt 257.0 lb

## 2012-07-11 DIAGNOSIS — T8090XA Unspecified complication following infusion and therapeutic injection, initial encounter: Secondary | ICD-10-CM | POA: Insufficient documentation

## 2012-07-11 DIAGNOSIS — T888XXA Other specified complications of surgical and medical care, not elsewhere classified, initial encounter: Secondary | ICD-10-CM

## 2012-07-11 NOTE — Telephone Encounter (Signed)
Will check at OV 
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
## 2012-07-11 NOTE — Assessment & Plan Note (Addendum)
With severe pain Hydrocodone no help He hasn't been using the gabapentin of late----will have him restart this now Can wean off when reaction wanes No infection or sterile abscess

## 2012-07-11 NOTE — Patient Instructions (Signed)
Please restart the gabapentin at 400mg  twice a day. After 2 days, if still not sleeping well due to pain, increase the bedtime dose to 800mg  and then 1200mg  (after another 2 days) You can go back to the full 400mg  twice a day and 1200mg  at bedtime if your pain is still not controlled. When your arm pain goes away, you can start decreasing the dose again and wean off it entirely again

## 2012-07-12 ENCOUNTER — Other Ambulatory Visit: Payer: Self-pay | Admitting: *Deleted

## 2012-07-12 MED ORDER — DIAZEPAM 2 MG PO TABS: 2.0000 mg | ORAL_TABLET | Freq: Three times a day (TID) | ORAL | Status: DC | PRN

## 2012-07-12 NOTE — Telephone Encounter (Signed)
Okay #90 x 0 Probably using more due to the reaction he had from the zostavax

## 2012-07-12 NOTE — Telephone Encounter (Signed)
rx called into pharmacy

## 2012-12-01 ENCOUNTER — Ambulatory Visit (INDEPENDENT_AMBULATORY_CARE_PROVIDER_SITE_OTHER): Payer: Medicare Other

## 2012-12-01 DIAGNOSIS — Z23 Encounter for immunization: Secondary | ICD-10-CM

## 2012-12-21 ENCOUNTER — Ambulatory Visit (INDEPENDENT_AMBULATORY_CARE_PROVIDER_SITE_OTHER): Payer: Medicare Other | Admitting: Internal Medicine

## 2012-12-21 ENCOUNTER — Encounter: Payer: Self-pay | Admitting: Internal Medicine

## 2012-12-21 VITALS — BP 128/70 | HR 71 | Wt 255.0 lb

## 2012-12-21 DIAGNOSIS — G5790 Unspecified mononeuropathy of unspecified lower limb: Secondary | ICD-10-CM

## 2012-12-21 DIAGNOSIS — M792 Neuralgia and neuritis, unspecified: Secondary | ICD-10-CM

## 2012-12-21 DIAGNOSIS — I2510 Atherosclerotic heart disease of native coronary artery without angina pectoris: Secondary | ICD-10-CM

## 2012-12-21 DIAGNOSIS — F411 Generalized anxiety disorder: Secondary | ICD-10-CM

## 2012-12-21 DIAGNOSIS — M069 Rheumatoid arthritis, unspecified: Secondary | ICD-10-CM

## 2012-12-21 DIAGNOSIS — I10 Essential (primary) hypertension: Secondary | ICD-10-CM

## 2012-12-21 MED ORDER — HYDROCODONE-ACETAMINOPHEN 5-325 MG PO TABS: 1.0000 | ORAL_TABLET | Freq: Four times a day (QID) | ORAL | Status: DC | PRN

## 2012-12-21 NOTE — Assessment & Plan Note (Signed)
Seems to be quiet On beta blocker, asa zetia since intolerant of statins
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
## 2012-12-21 NOTE — Assessment & Plan Note (Signed)
Uses the diazepam 1-2 times per month usually

## 2012-12-21 NOTE — Assessment & Plan Note (Signed)
Uses the hydrocodone rarely ---if pain bad at night

## 2012-12-21 NOTE — Assessment & Plan Note (Signed)
Fairly good control of flank and leg symptoms with the gabapentin

## 2012-12-21 NOTE — Assessment & Plan Note (Signed)
BP Readings from Last 3 Encounters:  12/21/12 128/70  07/11/12 150/70  06/21/12 138/80   Good control No changes needed

## 2012-12-30 ENCOUNTER — Other Ambulatory Visit: Payer: Self-pay | Admitting: Internal Medicine

## 2012-12-30 NOTE — Telephone Encounter (Signed)
Okay #90 x 0 

## 2012-12-30 NOTE — Telephone Encounter (Signed)
rx called into pharmacy

## 2012-12-30 NOTE — Telephone Encounter (Signed)
Last filled 07/12/12

## 2013-01-24 ENCOUNTER — Ambulatory Visit (INDEPENDENT_AMBULATORY_CARE_PROVIDER_SITE_OTHER): Payer: Medicare Other | Admitting: Podiatry

## 2013-01-24 ENCOUNTER — Encounter: Payer: Self-pay | Admitting: Podiatry

## 2013-01-24 VITALS — BP 140/72 | HR 65 | Resp 20 | Ht 72.0 in | Wt 240.0 lb

## 2013-01-24 DIAGNOSIS — Z79899 Other long term (current) drug therapy: Secondary | ICD-10-CM

## 2013-01-24 DIAGNOSIS — M79609 Pain in unspecified limb: Secondary | ICD-10-CM

## 2013-01-24 MED ORDER — GABAPENTIN 600 MG PO TABS: ORAL_TABLET | ORAL | Status: DC

## 2013-01-24 NOTE — Patient Instructions (Signed)
Be sure to discontinue the 400mg  gabapentin.

## 2013-01-24 NOTE — Progress Notes (Signed)
Jonathan Glass presents today as a 72 year old white male for followup of his Lyrica therapy. He states that the Lyrica seem to help to some degree however he started swelling so badly around his ankles that his primary Dr. discontinued the use of the Lyrica. His primary Dr. put him on 400 mg of gabapentin once in the morning once in the afternoon and twice at bedtime. He states that none of this is really helped.  Objective: Vital signs are stable he is alert and oriented x3. Pulses are palpable bilateral. Decrease in sensorium per since once the monofilament to the midfoot bilateral.  Assessment: Neuropathy bilateral.  Plan: Discussed etiology pathology conservative versus surgical therapies at this point in time I increase his gabapentin 600 mg. He'll take a 600 mg capsule in the morning, at lunch, and to before bedtime. I will followup with him in one month for med check.

## 2013-02-21 ENCOUNTER — Ambulatory Visit (INDEPENDENT_AMBULATORY_CARE_PROVIDER_SITE_OTHER): Payer: Medicare Other | Admitting: Cardiovascular Disease

## 2013-02-21 ENCOUNTER — Encounter: Payer: Self-pay | Admitting: Cardiovascular Disease

## 2013-02-21 VITALS — BP 145/77 | HR 66 | Ht 72.0 in | Wt 257.4 lb

## 2013-02-21 DIAGNOSIS — E785 Hyperlipidemia, unspecified: Secondary | ICD-10-CM

## 2013-02-21 DIAGNOSIS — I2510 Atherosclerotic heart disease of native coronary artery without angina pectoris: Secondary | ICD-10-CM
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
## 2013-02-21 NOTE — Patient Instructions (Signed)
Your physician wants you to follow-up in: 1 YEAR with Dr Cooper.  You will receive a reminder letter in the mail two months in advance. If you don't receive a letter, please call our office to schedule the follow-up appointment.  Your physician recommends that you continue on your current medications as directed. Please refer to the Current Medication list given to you today.  

## 2013-02-23 ENCOUNTER — Encounter: Payer: Self-pay | Admitting: Podiatry

## 2013-02-23 ENCOUNTER — Ambulatory Visit (INDEPENDENT_AMBULATORY_CARE_PROVIDER_SITE_OTHER): Payer: Medicare Other | Admitting: Podiatry

## 2013-02-23 VITALS — BP 161/81 | HR 86 | Resp 18

## 2013-02-23 DIAGNOSIS — Z79899 Other long term (current) drug therapy: Secondary | ICD-10-CM

## 2013-02-23 NOTE — Progress Notes (Signed)
Jonathan Glass presents today for followup of his treatment of neuropathy with Neurontin. He states it is about 50% better he says the pain has resolved from his toes however the numbness is still present.  Objective: Vital signs are stable he is alert and oriented x3 pulses remain palpable no change in his neurologic sensorium.  Assessment: Neuropathy bilateral foot.  Plan #1 continue the use of gabapentin 600 mg 3 times a day followup with me in 2 months to reevaluate.

## 2013-02-27 ENCOUNTER — Ambulatory Visit (INDEPENDENT_AMBULATORY_CARE_PROVIDER_SITE_OTHER): Payer: Medicare Other | Admitting: Internal Medicine

## 2013-02-27 ENCOUNTER — Encounter: Payer: Self-pay | Admitting: Internal Medicine

## 2013-02-27 VITALS — BP 140/80 | HR 79 | Temp 98.4°F | Wt 256.0 lb

## 2013-02-27 DIAGNOSIS — H1033 Unspecified acute conjunctivitis, bilateral: Secondary | ICD-10-CM | POA: Insufficient documentation

## 2013-02-27 DIAGNOSIS — H1030 Unspecified acute conjunctivitis, unspecified eye: Secondary | ICD-10-CM

## 2013-02-27 MED ORDER — SULFACETAMIDE SODIUM 10 % OP SOLN: 1.0000 [drp] | OPHTHALMIC | Status: DC

## 2013-02-27 NOTE — Patient Instructions (Signed)
Try the antibiotic drops. If your eyes remain tender or red, set up an appointment with your eye doctor

## 2013-02-27 NOTE — Assessment & Plan Note (Signed)
Will try sulfa drops for what seems like classic bacterial conjunctivitis If this doesn't work---back to ophtho (has felt some tenderness in eyes since cataract extraction)
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
## 2013-02-27 NOTE — Progress Notes (Signed)
Pre-visit discussion using our clinic review tool. No additional management support is needed unless otherwise documented below in the visit note.  

## 2013-03-27 ENCOUNTER — Other Ambulatory Visit: Payer: Self-pay | Admitting: Internal Medicine

## 2013-03-27 NOTE — Telephone Encounter (Signed)
Left message on machine that rx is ready for pick-up, and it will be at our front desk. rx called into pharmacy

## 2013-03-27 NOTE — Telephone Encounter (Signed)
Phone in the diazepam

## 2013-03-27 NOTE — Telephone Encounter (Signed)
diazepam 12/30/12 norco 12/21/12

## 2013-04-12 ENCOUNTER — Encounter: Payer: Self-pay | Admitting: Internal Medicine

## 2013-04-25 ENCOUNTER — Ambulatory Visit (INDEPENDENT_AMBULATORY_CARE_PROVIDER_SITE_OTHER): Payer: Medicare Other | Admitting: Podiatry

## 2013-04-25 ENCOUNTER — Encounter: Payer: Self-pay | Admitting: Podiatry

## 2013-04-25 VITALS — BP 144/76 | HR 70 | Resp 12

## 2013-04-25 DIAGNOSIS — Z79899 Other long term (current) drug therapy: Secondary | ICD-10-CM

## 2013-04-25 DIAGNOSIS — M79609 Pain in unspecified limb: Secondary | ICD-10-CM

## 2013-04-25 DIAGNOSIS — M2040 Other hammer toe(s) (acquired), unspecified foot: Secondary | ICD-10-CM

## 2013-04-25 NOTE — Progress Notes (Signed)
   Subjective:    Patient ID: Jonathan Glass, male    DOB: 12/17/1940, 73 y.o.   MRN: [id number]  HPI Comments: '' BOTH FEET STILL HAVE THE BURNING AND NUMBNESS SENSATION.''  Foot Pain      Review of Systems     Objective:   Physical Exam : Vital signs are stable alert and oriented x3. Still has pain and is only taking 400 mg in the morning and at lunch and 800 mg at bedtime.        Assessment & Plan:  Neuropathy bilateral foot.  Plan: Increase his gabapentin 800 mg 3 times a day followup with me in one month for reconsideration

## 2013-06-06 ENCOUNTER — Encounter: Payer: Self-pay | Admitting: Podiatry

## 2013-06-06 ENCOUNTER — Ambulatory Visit (INDEPENDENT_AMBULATORY_CARE_PROVIDER_SITE_OTHER): Payer: Medicare Other | Admitting: Podiatry

## 2013-06-06 VITALS — BP 138/72 | HR 86 | Resp 12

## 2013-06-06 DIAGNOSIS — M778 Other enthesopathies, not elsewhere classified: Secondary | ICD-10-CM

## 2013-06-06 DIAGNOSIS — Z79899 Other long term (current) drug therapy: Secondary | ICD-10-CM

## 2013-06-06 DIAGNOSIS — M779 Enthesopathy, unspecified: Principal | ICD-10-CM

## 2013-06-06 DIAGNOSIS — M7750 Other enthesopathy of unspecified foot: Secondary | ICD-10-CM

## 2013-06-06 NOTE — Progress Notes (Signed)
He presents today for followup for his diabetic peripheral neuropathy he states that as of recently he has started to develop pain across the forefoot the knee the second and then the third and fourth metatarsophalangeal joints. He states that he is now increase his gabapentin from 800 mg 3 times a day to 100 mg in the morning 1200 mg at lunch and 1200 mg at bedtime. He states that both of his feet started to hurt at the same time in the forefoot bilateral. He denies any trauma.  Objective: Vital signs are stable he is alert and oriented x3 pulses are palpable bilateral. No change in his neurovascular status. Deep tendon reflexes are intact bilateral. Pain on palpation and in range of motion of the second metatarsal and the third metatarsal and fourth metatarsophalangeal joints.  Assessment: Capsulitis forefoot bilateral with diabetic peripheral neuropathy bilateral.  Plan: Continue gabapentin as prescribed. I injected periarticular about the second and third metatarsophalangeal joints bilaterally. This injection was with Kenalog and local anesthetic. I will followup with him in one month and consider diabetic shoes or custom-made orthotics.

## 2013-06-30 ENCOUNTER — Encounter: Payer: Medicare Other | Admitting: Internal Medicine

## 2013-07-04 ENCOUNTER — Ambulatory Visit: Payer: Medicare Other | Admitting: Podiatry

## 2013-07-10 ENCOUNTER — Encounter: Payer: Medicare Other | Admitting: Internal Medicine

## 2013-07-18 ENCOUNTER — Encounter: Payer: Self-pay | Admitting: Podiatry

## 2013-07-18 ENCOUNTER — Ambulatory Visit (INDEPENDENT_AMBULATORY_CARE_PROVIDER_SITE_OTHER): Payer: Medicare Other | Admitting: Podiatry

## 2013-07-18 VITALS — BP 132/66 | HR 81 | Resp 17 | Ht 72.0 in | Wt 242.0 lb

## 2013-07-18 DIAGNOSIS — M778 Other enthesopathies, not elsewhere classified: Secondary | ICD-10-CM

## 2013-07-18 DIAGNOSIS — M7750 Other enthesopathy of unspecified foot: Secondary | ICD-10-CM

## 2013-07-18 DIAGNOSIS — Z79899 Other long term (current) drug therapy: Secondary | ICD-10-CM

## 2013-07-18 DIAGNOSIS — M779 Enthesopathy, unspecified: Secondary | ICD-10-CM

## 2013-07-18 NOTE — Progress Notes (Signed)
He presents today for followup of capsulitis of the forefoot bilateral. And the use of his gabapentin. He states he currently everything is feeling much better.  Objective: Vital signs are stable he is alert and oriented x3. There is no erythema edema cellulitis drainage or odor and no reproducible pain.  Assessment: Well-healing capsulitis and well-controlled neuropathy.  Plan: Continue current medical regimen followup with me on an as-needed basis.

## 2013-08-01 ENCOUNTER — Encounter: Payer: Self-pay | Admitting: Family Medicine

## 2013-08-01 ENCOUNTER — Ambulatory Visit (INDEPENDENT_AMBULATORY_CARE_PROVIDER_SITE_OTHER): Payer: Medicare Other | Admitting: Family Medicine

## 2013-08-01 VITALS — BP 124/76 | HR 64 | Temp 98.0°F | Wt 260.5 lb

## 2013-08-01 DIAGNOSIS — R0789 Other chest pain: Secondary | ICD-10-CM

## 2013-08-01 DIAGNOSIS — R071 Chest pain on breathing: Secondary | ICD-10-CM

## 2013-08-01 NOTE — Progress Notes (Signed)
Pre visit review using our clinic review tool, if applicable. No additional management support is needed unless otherwise documented below in the visit note. 

## 2013-08-01 NOTE — Assessment & Plan Note (Signed)
Anticipate neuropathy flare from recent increased stress. Interestingly at site of prior shingles outbreak 2-3 years ago. Already on gabapentin 600mg  tid, discussed ok to use extra gabapentin capsule PRN this burning neuropathic pain. May continue heating pad which helped last night, and rec tylenol trial prior to alleve given CKD. Pt agrees with plan. Not consistent with cardiac or pulmonary cause today.
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
## 2013-08-01 NOTE — Patient Instructions (Addendum)
I think this is possible flare of nerve pain - doesn't sound like heart or lung related. May take an extra gabapentin 600mg  if needed, otherwise continue heating pad and tylenol 500mg  as needed for discomfort. Let us know if not improving as stress level improves.

## 2013-08-25 ENCOUNTER — Encounter: Payer: Self-pay | Admitting: Family Medicine

## 2013-08-25 ENCOUNTER — Ambulatory Visit (INDEPENDENT_AMBULATORY_CARE_PROVIDER_SITE_OTHER): Payer: Medicare Other | Admitting: Family Medicine

## 2013-08-25 ENCOUNTER — Telehealth: Payer: Self-pay | Admitting: *Deleted

## 2013-08-25 VITALS — BP 146/84 | HR 80 | Temp 98.0°F | Wt 261.5 lb

## 2013-08-25 DIAGNOSIS — R42 Dizziness and giddiness: Secondary | ICD-10-CM

## 2013-08-25 MED ORDER — PROMETHAZINE HCL 25 MG PO TABS: 25.0000 mg | ORAL_TABLET | Freq: Three times a day (TID) | ORAL | Status: DC | PRN

## 2013-08-25 NOTE — Patient Instructions (Signed)
I think anxiety is contributing to meniere disease flare. Continue diazepam as needed for vertigo episodes, may use phenergan for nausea as needed (sent to pharmacy) Let's start sertraline 25mg  daily for mood. You can take diazepam for your flight for anxiety as well  Meniere's Disease You have Meniere's disease. Meniere's disease is a term for the recurrent symptoms (problems) of vertigo (the room or you seem to spin around), tinnitus (ringing in the ears), and hearing loss. The cause is unknown. These episodes often come on suddenly and without warning. They are sometimes associated with nausea (feeling sick to your stomach) and vomiting. There is no cure. A number of strategies may help the symptoms. Surgical help is available if conservative measures fail. HOME CARE INSTRUCTIONS   If you smoke, STOP.  Restrict your salt intake.  Stop caffeine consumption (caffeinated coffee, sodas, and chocolate).  Do not drive during or near the time of attacks.  Over the counter Antivert (meclizine) at a 25 mg dosage 3 times per day may be helpful. SEEK IMMEDIATE MEDICAL CARE IF:   Nausea and vomiting are continuous.  You have no relief from vertigo. MAKE SURE YOU:   Understand these instructions.  Will watch your condition.  Will get help right away if you are not doing well or get worse. Document Released: 02/28/2000 Document Revised: 05/25/2011 Document Reviewed: 03/02/2005 [facility] Patient Information 2014 [name].
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
## 2013-08-25 NOTE — Assessment & Plan Note (Signed)
In known meniere's disease, anticipate flare of same. Continue diazepam 2mg  prn, add on phenergan (meclizine not effective) . Anticipate anxiety contributing in part to flare - will start sertraline 25mg  daily for this, discussed need to be on med for 3-4 weeks prior to noticing improvement. Not consistent with central vertigo cause today.

## 2013-08-25 NOTE — Telephone Encounter (Addendum)
Filled and placed in my out box. 

## 2013-08-25 NOTE — Progress Notes (Signed)
Pre visit review using our clinic review tool, if applicable. No additional management support is needed unless otherwise documented below in the visit note. 

## 2013-08-25 NOTE — Telephone Encounter (Signed)
PA for promethazine in your IN box for completion.

## 2013-08-26 ENCOUNTER — Other Ambulatory Visit: Payer: Self-pay | Admitting: Internal Medicine

## 2013-08-26 NOTE — Addendum Note (Signed)
Addended by: Ria Bush on: 08/26/2013 10:25 AM   Modules accepted: Level of Service

## 2013-08-28 NOTE — Telephone Encounter (Signed)
Phoned in to pharmacy. 

## 2013-08-28 NOTE — Telephone Encounter (Signed)
Last filled 04/12/13?

## 2013-08-28 NOTE — Telephone Encounter (Signed)
Please phone in valium

## 2013-08-29 NOTE — Telephone Encounter (Signed)
PA denied. Message left advising patient. Asked to call and advise if he paid out of pocket for med or if he needed a substitute.

## 2013-09-12 ENCOUNTER — Ambulatory Visit (INDEPENDENT_AMBULATORY_CARE_PROVIDER_SITE_OTHER): Payer: Medicare Other | Admitting: Internal Medicine

## 2013-09-12 ENCOUNTER — Encounter: Payer: Self-pay | Admitting: Internal Medicine

## 2013-09-12 VITALS — BP 130/70 | HR 73 | Temp 98.0°F | Wt 258.0 lb

## 2013-09-12 DIAGNOSIS — J019 Acute sinusitis, unspecified: Secondary | ICD-10-CM | POA: Insufficient documentation

## 2013-09-12 DIAGNOSIS — J0100 Acute maxillary sinusitis, unspecified: Secondary | ICD-10-CM

## 2013-09-12 MED ORDER — AMOXICILLIN-POT CLAVULANATE 875-125 MG PO TABS: 1.0000 | ORAL_TABLET | Freq: Two times a day (BID) | ORAL | Status: DC

## 2013-09-12 NOTE — Assessment & Plan Note (Signed)
Productive cough but doesn't seem to be from pulmonary source Discussed analgesics Will treat with augmentin
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
## 2013-09-12 NOTE — Progress Notes (Signed)
Pre visit review using our clinic review tool, if applicable. No additional management support is needed unless otherwise documented below in the visit note. 

## 2013-10-02 ENCOUNTER — Telehealth: Payer: Self-pay | Admitting: Internal Medicine

## 2013-10-02 NOTE — Telephone Encounter (Signed)
Form on your desk  

## 2013-10-02 NOTE — Telephone Encounter (Signed)
Pt dropped off handicap placard form  On dee's desk

## 2013-10-03 NOTE — Telephone Encounter (Signed)
Spoke with patient and advised form ready for pick-up and it will be at the front desk.

## 2013-10-03 NOTE — Telephone Encounter (Signed)
Form done No charge 

## 2013-11-14 ENCOUNTER — Telehealth: Payer: Self-pay | Admitting: *Deleted

## 2013-11-14 ENCOUNTER — Ambulatory Visit (INDEPENDENT_AMBULATORY_CARE_PROVIDER_SITE_OTHER): Payer: Medicare Other

## 2013-11-14 DIAGNOSIS — Z23 Encounter for immunization: Secondary | ICD-10-CM

## 2013-11-14 MED ORDER — HYDROCODONE-ACETAMINOPHEN 5-325 MG PO TABS: 1.0000 | ORAL_TABLET | Freq: Three times a day (TID) | ORAL | Status: DC | PRN

## 2013-11-14 NOTE — Telephone Encounter (Signed)
Patient came to office for flu shot. Patient requested a refill on his Hydrocodone-apap 5-500, take one by mouth every 6 hours as needed. Patient stated that he does not use this very often. Medication is no longer on medication sheet. Last refill 12/21/12 #30, last office visit 09/12/12. Patient has an appointment scheduled for 03/06/14. Please call when ready for pickup.

## 2013-11-14 NOTE — Telephone Encounter (Signed)
Only 5/325 made now Rx done

## 2013-11-14 NOTE — Telephone Encounter (Signed)
Spoke with patient and advised rx ready for pick-up and it will be at the front desk.  

## 2013-12-06 ENCOUNTER — Other Ambulatory Visit: Payer: Self-pay | Admitting: Internal Medicine

## 2013-12-06 NOTE — Telephone Encounter (Signed)
Okay #90 x 0 

## 2013-12-06 NOTE — Telephone Encounter (Signed)
rx called into pharmacy

## 2013-12-06 NOTE — Telephone Encounter (Signed)
08/28/13 

## 2013-12-09 ENCOUNTER — Encounter: Payer: Self-pay | Admitting: Internal Medicine

## 2013-12-18 ENCOUNTER — Encounter: Payer: Medicare Other | Admitting: Internal Medicine

## 2013-12-25 ENCOUNTER — Encounter: Payer: Medicare Other | Admitting: Internal Medicine

## 2014-01-16 ENCOUNTER — Ambulatory Visit: Payer: Medicare Other | Admitting: Podiatry

## 2014-02-02 ENCOUNTER — Ambulatory Visit (INDEPENDENT_AMBULATORY_CARE_PROVIDER_SITE_OTHER): Payer: Medicare Other | Admitting: Internal Medicine

## 2014-02-02 ENCOUNTER — Encounter: Payer: Self-pay | Admitting: Internal Medicine

## 2014-02-02 VITALS — BP 140/80 | HR 62 | Temp 97.5°F | Wt 268.0 lb

## 2014-02-02 DIAGNOSIS — F324 Major depressive disorder, single episode, in partial remission: Secondary | ICD-10-CM | POA: Insufficient documentation

## 2014-02-02 DIAGNOSIS — F322 Major depressive disorder, single episode, severe without psychotic features: Secondary | ICD-10-CM

## 2014-02-02 DIAGNOSIS — R42 Dizziness and giddiness: Secondary | ICD-10-CM

## 2014-02-02 DIAGNOSIS — F39 Unspecified mood [affective] disorder: Secondary | ICD-10-CM | POA: Insufficient documentation

## 2014-02-02 MED ORDER — SERTRALINE HCL 50 MG PO TABS: 50.0000 mg | ORAL_TABLET | Freq: Every day | ORAL | Status: DC

## 2014-02-02 NOTE — Progress Notes (Signed)
Pre visit review using our clinic review tool, if applicable. No additional management support is needed unless otherwise documented below in the visit note. 

## 2014-02-02 NOTE — Patient Instructions (Addendum)
Please start the sertraline today. Cut the first 2 in half and take 1/2 tab for the first 4 days. Then go up to the full tab. If you have increased agitation or thoughts of dying, stop the med and call me  Depression Depression refers to feeling sad, low, down in the dumps, blue, gloomy, or empty. In general, there are two kinds of depression: 1. Normal sadness or normal grief. This kind of depression is one that we all feel from time to time after upsetting life experiences, such as the loss of a job or the ending of a relationship. This kind of depression is considered normal, is short lived, and resolves within a few days to 2 weeks. Depression experienced after the loss of a loved one (bereavement) often lasts longer than 2 weeks but normally gets better with time. 2. Clinical depression. This kind of depression lasts longer than normal sadness or normal grief or interferes with your ability to function at home, at work, and in school. It also interferes with your personal relationships. It affects almost every aspect of your life. Clinical depression is an illness. Symptoms of depression can also be caused by conditions other than those mentioned above, such as:  Physical illness. Some physical illnesses, including underactive thyroid gland (hypothyroidism), severe anemia, specific types of cancer, diabetes, uncontrolled seizures, heart and lung problems, strokes, and chronic pain are commonly associated with symptoms of depression.  Side effects of some prescription medicine. In some people, certain types of medicine can cause symptoms of depression.  Substance abuse. Abuse of alcohol and illicit drugs can cause symptoms of depression. SYMPTOMS Symptoms of normal sadness and normal grief include the following:  Feeling sad or crying for short periods of time.  Not caring about anything (apathy).  Difficulty sleeping or sleeping too much.  No longer able to enjoy the things you used to  enjoy.  Desire to be by oneself all the time (social isolation).  Lack of energy or motivation.  Difficulty concentrating or remembering.  Change in appetite or weight.  Restlessness or agitation. Symptoms of clinical depression include the same symptoms of normal sadness or normal grief and also the following symptoms:  Feeling sad or crying all the time.  Feelings of guilt or worthlessness.  Feelings of hopelessness or helplessness.  Thoughts of suicide or the desire to harm yourself (suicidal ideation).  Loss of touch with reality (psychotic symptoms). Seeing or hearing things that are not real (hallucinations) or having false beliefs about your life or the people around you (delusions and paranoia). DIAGNOSIS  The diagnosis of clinical depression is usually based on how bad the symptoms are and how long they have lasted. Your health care provider will also ask you questions about your medical history and substance use to find out if physical illness, use of prescription medicine, or substance abuse is causing your depression. Your health care provider may also order blood tests. TREATMENT  Often, normal sadness and normal grief do not require treatment. However, sometimes antidepressant medicine is given for bereavement to ease the depressive symptoms until they resolve. The treatment for clinical depression depends on how bad the symptoms are but often includes antidepressant medicine, counseling with a mental health professional, or both. Your health care provider will help to determine what treatment is best for you. Depression caused by physical illness usually goes away with appropriate medical treatment of the illness. If prescription medicine is causing depression, talk with your health care provider about stopping the  medicine, decreasing the dose, or changing to another medicine. Depression caused by the abuse of alcohol or illicit drugs goes away when you stop using these  substances. Some adults need professional help in order to stop drinking or using drugs. SEEK IMMEDIATE MEDICAL CARE IF:  You have thoughts about hurting yourself or others.  You lose touch with reality (have psychotic symptoms).  You are taking medicine for depression and have a serious side effect. FOR MORE INFORMATION  National [employer] on Mental Illness: www.nami.[employer]: https://carter.com/ Document Released: 02/28/2000 Document Revised: 07/17/2013 Document Reviewed: 06/01/2011 [facility] Patient Information 2015 [place], [place]. This information is not intended to replace advice given to you by your health care provider. Make sure you discuss any questions you have with your health care provider.

## 2014-02-02 NOTE — Assessment & Plan Note (Signed)
Likely peripheral --no signs of CVA History doesn't suggest Meniere's Probably limited other therapeutic options--will send note to Dr Wilburn Cornelia Probably worse due to depression
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
## 2014-02-02 NOTE — Assessment & Plan Note (Signed)
Mood issues started with losing wife and have progressively worsened Now stress with vertigo exacerbating more Denies suicidal plan--just thinks about dying Will start med and have early follow up Consider psychologist

## 2014-02-23 ENCOUNTER — Encounter: Payer: Self-pay | Admitting: Internal Medicine

## 2014-02-23 ENCOUNTER — Ambulatory Visit (INDEPENDENT_AMBULATORY_CARE_PROVIDER_SITE_OTHER): Payer: Medicare Other | Admitting: Internal Medicine

## 2014-02-23 VITALS — BP 138/70 | HR 73 | Wt 270.0 lb

## 2014-02-23 DIAGNOSIS — F322 Major depressive disorder, single episode, severe without psychotic features: Secondary | ICD-10-CM

## 2014-02-23 MED ORDER — FUROSEMIDE 40 MG PO TABS: 40.0000 mg | ORAL_TABLET | Freq: Two times a day (BID) | ORAL | Status: DC | PRN

## 2014-02-23 MED ORDER — DIAZEPAM 2 MG PO TABS: 2.0000 mg | ORAL_TABLET | Freq: Three times a day (TID) | ORAL | Status: DC | PRN

## 2014-02-23 MED ORDER — HYDROCODONE-ACETAMINOPHEN 5-325 MG PO TABS: 1.0000 | ORAL_TABLET | Freq: Three times a day (TID) | ORAL | Status: DC | PRN

## 2014-02-23 NOTE — Assessment & Plan Note (Signed)
Clear good response to the sertraline No need to increase but discussed that if the effect wanes--will need to increase ?try silver sneakers to get out of house Doesn't want counseling

## 2014-02-23 NOTE — Progress Notes (Signed)
Pre visit review using our clinic review tool, if applicable. No additional management support is needed unless otherwise documented below in the visit note. 
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
## 2014-02-23 NOTE — Patient Instructions (Signed)
[facility]. Call if the depression worsens again. I will need to increase the sertraline if that happens

## 2014-02-26 ENCOUNTER — Encounter: Payer: Self-pay | Admitting: Internal Medicine

## 2014-03-02 ENCOUNTER — Other Ambulatory Visit: Payer: Medicare Other

## 2014-03-06 ENCOUNTER — Encounter: Payer: Medicare Other | Admitting: Internal Medicine

## 2014-03-15 ENCOUNTER — Ambulatory Visit: Payer: Medicare Other | Admitting: Cardiovascular Disease

## 2014-03-21 ENCOUNTER — Encounter: Payer: Self-pay | Admitting: Diagnostic Neuroimaging

## 2014-03-21 ENCOUNTER — Ambulatory Visit (INDEPENDENT_AMBULATORY_CARE_PROVIDER_SITE_OTHER): Payer: Medicare Other | Admitting: Diagnostic Neuroimaging

## 2014-03-21 VITALS — Ht 72.0 in | Wt 259.8 lb

## 2014-03-21 DIAGNOSIS — G450 Vertebro-basilar artery syndrome: Secondary | ICD-10-CM | POA: Diagnosis not present

## 2014-03-21 DIAGNOSIS — R42 Dizziness and giddiness: Secondary | ICD-10-CM | POA: Diagnosis not present
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
## 2014-03-21 NOTE — Patient Instructions (Signed)
I will check MRI and MRA scans.  Follow up with cardiology.

## 2014-03-26 DIAGNOSIS — H9313 Tinnitus, bilateral: Secondary | ICD-10-CM | POA: Diagnosis not present

## 2014-03-26 DIAGNOSIS — H903 Sensorineural hearing loss, bilateral: Secondary | ICD-10-CM | POA: Diagnosis not present

## 2014-03-26 DIAGNOSIS — R42 Dizziness and giddiness: Secondary | ICD-10-CM | POA: Diagnosis not present

## 2014-03-30 ENCOUNTER — Ambulatory Visit: Payer: Self-pay | Admitting: Physician Assistant

## 2014-04-05 ENCOUNTER — Other Ambulatory Visit: Payer: Self-pay

## 2014-04-05 ENCOUNTER — Inpatient Hospital Stay: Admission: RE | Admit: 2014-04-05 | Payer: Self-pay | Source: Ambulatory Visit

## 2014-04-12 DIAGNOSIS — M25561 Pain in right knee: Secondary | ICD-10-CM | POA: Diagnosis not present

## 2014-04-26 ENCOUNTER — Telehealth: Payer: Self-pay | Admitting: Internal Medicine

## 2014-04-26 NOTE — Telephone Encounter (Signed)
Patient has an appointment on 05/03/14 for his wellness exam.  Patient said you had told him to come in a couple days before for lab work.  Patient isn't scheduled for lab work. Do you want him to have his lab work done on the same day as his appointment or before?

## 2014-04-26 NOTE — Telephone Encounter (Signed)
Let him know that I can do any blood work at the visit. Since it is at 3:45, if he doesn't eat after ~12:30PM (lunch), we should not have any problems doing it then

## 2014-04-27 ENCOUNTER — Ambulatory Visit
Admission: RE | Admit: 2014-04-27 | Discharge: 2014-04-27 | Disposition: A | Discharge status: Home / Self Care | Payer: Medicare Other | Source: Ambulatory Visit | Attending: Diagnostic Neuroimaging | Admitting: Diagnostic Neuroimaging

## 2014-04-27 DIAGNOSIS — R42 Dizziness and giddiness: Secondary | ICD-10-CM

## 2014-04-27 DIAGNOSIS — G450 Vertebro-basilar artery syndrome: Secondary | ICD-10-CM

## 2014-04-27 MED ORDER — GADOBENATE DIMEGLUMINE 529 MG/ML IV SOLN
20.0000 mL | Freq: Once | INTRAVENOUS | Status: AC | PRN
  Administered 2014-04-27: 20 mL via INTRAVENOUS

## 2014-05-01 ENCOUNTER — Encounter: Payer: Self-pay | Admitting: Diagnostic Neuroimaging

## 2014-05-01 ENCOUNTER — Ambulatory Visit (INDEPENDENT_AMBULATORY_CARE_PROVIDER_SITE_OTHER): Payer: Medicare Other | Admitting: Diagnostic Neuroimaging

## 2014-05-01 VITALS — BP 143/72 | HR 79 | Ht 72.0 in | Wt 263.0 lb

## 2014-05-01 DIAGNOSIS — R42 Dizziness and giddiness: Secondary | ICD-10-CM | POA: Diagnosis not present
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
## 2014-05-02 ENCOUNTER — Other Ambulatory Visit: Payer: Self-pay

## 2014-05-02 NOTE — Telephone Encounter (Signed)
Approved: diazepam #90 x 0 Others are okay for a year If he doesn't want the diazepam local--will need to print it

## 2014-05-02 NOTE — Telephone Encounter (Signed)
Please advise if okay to refill to mail order--last OV 02/23/14

## 2014-05-03 ENCOUNTER — Encounter: Payer: Medicare Other | Admitting: Internal Medicine

## 2014-05-03 ENCOUNTER — Telehealth: Payer: Self-pay | Admitting: Internal Medicine

## 2014-05-03 NOTE — Telephone Encounter (Signed)
Had a scheduled wellness visit today which he couldn't make Can check with him about seeing someone else tomorrow if he is not better Reschedule the wellness visit

## 2014-05-03 NOTE — Telephone Encounter (Signed)
okay

## 2014-05-03 NOTE — Telephone Encounter (Signed)
I spoke with patient.  He said he'd check with his granddaughter when she gets home to see if she can bring him tomorrow.  Patient said his wellness exam was already rescheduled this morning to June.

## 2014-05-03 NOTE — Telephone Encounter (Signed)
Patient Name: Jonathan Glass  DOB: 1941-02-10    Initial Comment Caller states has been sick for 2 days w/ stomach and still feels bad    Nurse Assessment  Nurse: Mallie Mussel, RN, Alveta Heimlich Date/Time (Eastern Time): 05/03/2014 2:11:10 PM  Confirm and document reason for call. If symptomatic, describe symptoms. ---Caller states that he has had diarrhea x 2 days. He has not had diarrhea yet today, but he has not had a BM yet today either. He was vomiting on Tuesday also. He states that he still feels bad, terrible, very tired. His granddaughter had the same thing last week. He last urinated a few minutes ago. Denies fever.  Has the patient traveled out of the country within the last 30 days? ---No  Does the patient require triage? ---Yes  Related visit to physician within the last 2 weeks? ---No  Does the PT have any chronic conditions? (i.e. diabetes, asthma, etc.) ---Yes  List chronic conditions. ---HTN     Guidelines    Guideline Title Affirmed Question Affirmed Notes  Diarrhea Age > 52 years    Final Disposition User   See Physician within [facility], Therapist, sports, Alveta Heimlich    Comments  Caller wants to wait until his granddaughter comes home and he will have her to call to make the appointment. In the Disposition EMR, I selected Appointment attempted but not scheduled, closest to this situation.

## 2014-05-04 ENCOUNTER — Encounter: Payer: Self-pay | Admitting: *Deleted

## 2014-05-04 MED ORDER — COLCHICINE 0.6 MG PO TABS: 0.6000 mg | ORAL_TABLET | Freq: Every day | ORAL | Status: DC

## 2014-05-04 MED ORDER — SERTRALINE HCL 50 MG PO TABS: 50.0000 mg | ORAL_TABLET | Freq: Every day | ORAL | Status: DC

## 2014-05-04 MED ORDER — FUROSEMIDE 40 MG PO TABS: 40.0000 mg | ORAL_TABLET | Freq: Two times a day (BID) | ORAL | Status: DC | PRN

## 2014-05-04 NOTE — Telephone Encounter (Signed)
Left detailed msg on VM per HIPAA in reference to diazepam being called in to local pharmacy or will he want that mail order as well--other Rx have been sent to pharmacy

## 2014-05-04 NOTE — Telephone Encounter (Signed)
Encounter opened in error

## 2014-05-09 ENCOUNTER — Encounter: Payer: Self-pay | Admitting: Internal Medicine

## 2014-05-10 ENCOUNTER — Other Ambulatory Visit: Payer: Self-pay | Admitting: Internal Medicine

## 2014-05-11 DIAGNOSIS — H905 Unspecified sensorineural hearing loss: Secondary | ICD-10-CM | POA: Diagnosis not present

## 2014-05-11 DIAGNOSIS — H6063 Unspecified chronic otitis externa, bilateral: Secondary | ICD-10-CM | POA: Diagnosis not present

## 2014-05-11 DIAGNOSIS — R42 Dizziness and giddiness: Secondary | ICD-10-CM | POA: Diagnosis not present

## 2014-05-11 NOTE — Telephone Encounter (Signed)
Electronic refill request for Valium.  Last seen 02/23/2014.  Last filled 02/23/2014.  Please advise.

## 2014-05-11 NOTE — Telephone Encounter (Signed)
Approved: #[id number] x 0 

## 2014-05-11 NOTE — Telephone Encounter (Signed)
Valium rx called into pharmacy per Dr Silvio Pate.

## 2014-05-18 DIAGNOSIS — H60503 Unspecified acute noninfective otitis externa, bilateral: Secondary | ICD-10-CM | POA: Diagnosis not present

## 2014-05-18 DIAGNOSIS — H905 Unspecified sensorineural hearing loss: Secondary | ICD-10-CM | POA: Diagnosis not present

## 2014-05-18 DIAGNOSIS — H8109 Meniere's disease, unspecified ear: Secondary | ICD-10-CM | POA: Diagnosis not present

## 2014-05-24 ENCOUNTER — Ambulatory Visit (INDEPENDENT_AMBULATORY_CARE_PROVIDER_SITE_OTHER): Payer: Medicare Other | Admitting: Cardiovascular Disease

## 2014-05-24 ENCOUNTER — Encounter: Payer: Self-pay | Admitting: Cardiovascular Disease

## 2014-05-24 VITALS — BP 154/84 | HR 67 | Ht 72.0 in | Wt 257.8 lb

## 2014-05-24 DIAGNOSIS — R0602 Shortness of breath: Secondary | ICD-10-CM | POA: Diagnosis not present

## 2014-05-24 DIAGNOSIS — I10 Essential (primary) hypertension: Secondary | ICD-10-CM | POA: Diagnosis not present

## 2014-05-24 DIAGNOSIS — E785 Hyperlipidemia, unspecified: Secondary | ICD-10-CM
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
## 2014-05-24 NOTE — Patient Instructions (Signed)
Your physician has requested that you have an echocardiogram. Echocardiography is a painless test that uses sound waves to create images of your heart. It provides your doctor with information about the size and shape of your heart and how well your heart's chambers and valves are working. This procedure takes approximately one hour. There are no restrictions for this procedure.  Your physician wants you to follow-up in: 1 YEAR with [name]. You will receive a reminder letter in the mail two months in advance. If you don't receive a letter, please call our office to schedule the follow-up appointment.  Your physician recommends that you continue on your current medications as directed. Please refer to the Current Medication list given to you today.

## 2014-05-29 ENCOUNTER — Ambulatory Visit (HOSPITAL_COMMUNITY): Payer: Medicare Other | Attending: Cardiovascular Disease | Admitting: Radiology

## 2014-05-29 DIAGNOSIS — R0602 Shortness of breath: Secondary | ICD-10-CM | POA: Diagnosis not present

## 2014-05-29 DIAGNOSIS — E785 Hyperlipidemia, unspecified: Secondary | ICD-10-CM | POA: Insufficient documentation

## 2014-05-29 DIAGNOSIS — I10 Essential (primary) hypertension: Secondary | ICD-10-CM | POA: Insufficient documentation

## 2014-05-29 NOTE — Progress Notes (Signed)
Echocardiogram performed.  

## 2014-05-30 ENCOUNTER — Encounter: Payer: Self-pay | Admitting: Cardiovascular Disease

## 2014-05-30 DIAGNOSIS — H6063 Unspecified chronic otitis externa, bilateral: Secondary | ICD-10-CM | POA: Diagnosis not present

## 2014-05-30 NOTE — Telephone Encounter (Signed)
This encounter was created in error - please disregard.

## 2014-05-30 NOTE — Telephone Encounter (Signed)
New Msg        Pt returning call about labs.  Please call back.

## 2014-06-07 ENCOUNTER — Ambulatory Visit: Payer: Medicare Other | Admitting: Podiatry

## 2014-06-07 ENCOUNTER — Encounter: Payer: Self-pay | Admitting: Podiatry

## 2014-06-07 ENCOUNTER — Ambulatory Visit (INDEPENDENT_AMBULATORY_CARE_PROVIDER_SITE_OTHER): Payer: Medicare Other | Admitting: Podiatry

## 2014-06-07 VITALS — BP 155/68 | HR 61 | Resp 17

## 2014-06-07 DIAGNOSIS — M779 Enthesopathy, unspecified: Principal | ICD-10-CM

## 2014-06-07 DIAGNOSIS — M7750 Other enthesopathy of unspecified foot: Secondary | ICD-10-CM | POA: Diagnosis not present

## 2014-06-07 DIAGNOSIS — M778 Other enthesopathies, not elsewhere classified: Secondary | ICD-10-CM

## 2014-06-07 NOTE — Progress Notes (Signed)
He presents today for follow-up of his forefoot bilateral. He states that the gabapentin seems to be working nicely and I have breakthrough pain every once in a while. He states however this pain is not neuropathic pain and it hurts right in here as he points to the first and second metatarsophalangeal joints bilateral.  Objective: Vital signs are stable he is alert and oriented 3. Pulses are palpable bilateral. He has pain on palpation and end range of motion of the second metatarsophalangeal joint bilaterally.  Assessment: Capsulitis second metatarsophalangeal joint bilateral with neuropathy bilateral.  Plan: I injected the bilateral foot today at the metatarsophalangeal joint after sterile Betadine skin prep. I injected with Kenalog and local anesthetic follow-up with him as needed.

## 2014-06-28 DIAGNOSIS — H6243 Otitis externa in other diseases classified elsewhere, bilateral: Secondary | ICD-10-CM | POA: Diagnosis not present

## 2014-06-28 DIAGNOSIS — H6063 Unspecified chronic otitis externa, bilateral: Secondary | ICD-10-CM | POA: Diagnosis not present

## 2014-06-28 DIAGNOSIS — Z974 Presence of external hearing-aid: Secondary | ICD-10-CM | POA: Diagnosis not present

## 2014-06-28 DIAGNOSIS — H903 Sensorineural hearing loss, bilateral: Secondary | ICD-10-CM | POA: Diagnosis not present

## 2014-06-28 DIAGNOSIS — R42 Dizziness and giddiness: Secondary | ICD-10-CM | POA: Diagnosis not present

## 2014-07-17 ENCOUNTER — Other Ambulatory Visit: Payer: Self-pay | Admitting: Internal Medicine

## 2014-07-17 ENCOUNTER — Encounter: Payer: Medicare Other | Admitting: Internal Medicine

## 2014-07-17 NOTE — Telephone Encounter (Signed)
Approved: diazepam #90 x 0 Find out why he is requesting the other meds (eye drops are for pink eye)

## 2014-07-17 NOTE — Telephone Encounter (Signed)
Spoke with patient and he uses the eye drops for when he mows the yard and his eyes turn red, I advised maybe he could use some OTC allergy drops but I would ask. Also pt states he takes norco for his gout or back. Please advise

## 2014-07-17 NOTE — Telephone Encounter (Signed)
Valium 05/11/2014 norco no longer on active med list, last filled 03/2013

## 2014-07-18 DIAGNOSIS — L821 Other seborrheic keratosis: Secondary | ICD-10-CM | POA: Diagnosis not present

## 2014-07-18 DIAGNOSIS — D225 Melanocytic nevi of trunk: Secondary | ICD-10-CM | POA: Diagnosis not present

## 2014-07-19 NOTE — Telephone Encounter (Signed)
Pt left v/m requesting cb to see if pain med rx was going to be written and if so when could be picked up.

## 2014-07-19 NOTE — Addendum Note (Signed)
Addended by: Despina Hidden on: 07/19/2014 04:11 PM   Modules accepted: Orders

## 2014-07-20 DIAGNOSIS — H6062 Unspecified chronic otitis externa, left ear: Secondary | ICD-10-CM | POA: Diagnosis not present

## 2014-07-20 MED ORDER — HYDROCODONE-ACETAMINOPHEN 5-500 MG PO TABS: 1.0000 | ORAL_TABLET | Freq: Three times a day (TID) | ORAL | Status: DC | PRN

## 2014-07-20 NOTE — Telephone Encounter (Signed)
Spoke with patient and advised rx ready for pick-up and it will be at the front desk.  

## 2014-07-20 NOTE — Addendum Note (Signed)
Addended by: Viviana Simpler I on: 07/20/2014 07:15 AM   Modules accepted: Orders

## 2014-07-20 NOTE — Telephone Encounter (Signed)
Okay for small amount of hydrocodone  He should not use antibiotic drops for allergies. He can get OTC med like you suggested--- like naphcon-A--- and use that before he goes out to [employer]

## 2014-07-25 ENCOUNTER — Encounter: Payer: Self-pay | Admitting: Internal Medicine

## 2014-08-20 DIAGNOSIS — H903 Sensorineural hearing loss, bilateral: Secondary | ICD-10-CM | POA: Diagnosis not present

## 2014-08-20 DIAGNOSIS — H6063 Unspecified chronic otitis externa, bilateral: Secondary | ICD-10-CM | POA: Diagnosis not present

## 2014-08-27 DIAGNOSIS — H6063 Unspecified chronic otitis externa, bilateral: Secondary | ICD-10-CM | POA: Diagnosis not present

## 2014-09-11 ENCOUNTER — Ambulatory Visit (INDEPENDENT_AMBULATORY_CARE_PROVIDER_SITE_OTHER): Payer: Medicare Other | Admitting: Internal Medicine

## 2014-09-11 ENCOUNTER — Encounter: Payer: Self-pay | Admitting: Internal Medicine

## 2014-09-11 VITALS — BP 140/78 | HR 61 | Temp 98.4°F | Ht 72.0 in | Wt 257.0 lb

## 2014-09-11 DIAGNOSIS — G629 Polyneuropathy, unspecified: Secondary | ICD-10-CM | POA: Diagnosis not present

## 2014-09-11 DIAGNOSIS — M792 Neuralgia and neuritis, unspecified: Secondary | ICD-10-CM

## 2014-09-11 DIAGNOSIS — F324 Major depressive disorder, single episode, in partial remission: Secondary | ICD-10-CM

## 2014-09-11 DIAGNOSIS — I2510 Atherosclerotic heart disease of native coronary artery without angina pectoris: Secondary | ICD-10-CM | POA: Diagnosis not present

## 2014-09-11 DIAGNOSIS — E785 Hyperlipidemia, unspecified: Secondary | ICD-10-CM

## 2014-09-11 DIAGNOSIS — M069 Rheumatoid arthritis, unspecified: Secondary | ICD-10-CM | POA: Diagnosis not present

## 2014-09-11 DIAGNOSIS — Z7189 Other specified counseling: Secondary | ICD-10-CM

## 2014-09-11 DIAGNOSIS — Z23 Encounter for immunization: Secondary | ICD-10-CM | POA: Diagnosis not present

## 2014-09-11 DIAGNOSIS — Z0000 Encounter for general adult medical examination without abnormal findings: Secondary | ICD-10-CM | POA: Diagnosis not present

## 2014-09-11 LAB — COMPREHENSIVE METABOLIC PANEL
ALBUMIN: 3.9 g/dL (ref 3.5–5.2)
ALT: 19 U/L (ref 0–53)
AST: 21 U/L (ref 0–37)
Alkaline Phosphatase: 67 U/L (ref 39–117)
BUN: 20 mg/dL (ref 6–23)
CHLORIDE: 103 meq/L (ref 96–112)
CO2: 29 meq/L (ref 19–32)
Calcium: 9.5 mg/dL (ref 8.4–10.5)
Creatinine, Ser: 1.35 mg/dL (ref 0.40–1.50)
GFR: 54.71 mL/min — ABNORMAL LOW (ref >60.00)
Glucose, Bld: 95 mg/dL (ref 70–99)
Potassium: 4.7 mEq/L (ref 3.5–5.1)
SODIUM: 139 meq/L (ref 135–145)
Total Bilirubin: 0.9 mg/dL (ref 0.2–1.2)
Total Protein: 7.1 g/dL (ref 6.0–8.3)

## 2014-09-11 LAB — CBC WITH DIFFERENTIAL/PLATELET
Basophils Absolute: 0.1 10*3/uL (ref 0.0–0.1)
Basophils Relative: 0.5 % (ref 0.0–3.0)
EOS PCT: 3.1 % (ref 0.0–5.0)
Eosinophils Absolute: 0.3 10*3/uL (ref 0.0–0.7)
HEMATOCRIT: 48.7 % (ref 39.0–52.0)
HEMOGLOBIN: 16.1 g/dL (ref 13.0–17.0)
LYMPHS ABS: 2.5 10*3/uL (ref 0.7–4.0)
Lymphocytes Relative: 26 % (ref 12.0–46.0)
MCHC: 33.1 g/dL (ref 30.0–36.0)
MCV: 90.2 fl (ref 78.0–100.0)
Monocytes Absolute: 0.7 10*3/uL (ref 0.1–1.0)
Monocytes Relative: 7.1 % (ref 3.0–12.0)
NEUTROS ABS: 6.1 10*3/uL (ref 1.4–7.7)
Neutrophils Relative %: 63.3 % (ref 43.0–77.0)
PLATELETS: 204 10*3/uL (ref 150.0–400.0)
RBC: 5.4 Mil/uL (ref 4.22–5.81)
RDW: 15 % (ref 11.5–15.5)
WBC: 9.7 10*3/uL (ref 4.0–10.5)

## 2014-09-11 LAB — LIPID PANEL
Cholesterol: 166 mg/dL (ref 0–200)
HDL: 42.3 mg/dL (ref >39.00)
LDL Cholesterol: 103 mg/dL — ABNORMAL HIGH (ref 0–99)
NonHDL: 123.7
TRIGLYCERIDES: 104 mg/dL (ref 0.0–149.0)
Total CHOL/HDL Ratio: 4
VLDL: 20.8 mg/dL (ref 0.0–40.0)

## 2014-09-11 LAB — T4, FREE: Free T4: 0.76 ng/dL (ref 0.60–1.60)

## 2014-09-11 NOTE — Addendum Note (Signed)
Addended by: Despina Hidden on: 09/11/2014 01:00 PM   Modules accepted: Orders

## 2014-09-11 NOTE — Assessment & Plan Note (Signed)
Statin intolerant 

## 2014-09-11 NOTE — Assessment & Plan Note (Signed)
Doesn't really seem to be active No Rx at this point

## 2014-09-11 NOTE — Assessment & Plan Note (Signed)
Quite severe Continues on the gabapentin Hasn't needed the hydrocodone

## 2014-09-11 NOTE — Assessment & Plan Note (Signed)
Has DNR 

## 2014-09-11 NOTE — Progress Notes (Signed)
Pre visit review using our clinic review tool, if applicable. No additional management support is needed unless otherwise documented below in the visit note. 

## 2014-09-11 NOTE — Assessment & Plan Note (Signed)
Has DOE which is probably deconditioning Discussed fitness

## 2014-09-11 NOTE — Assessment & Plan Note (Signed)
I have personally reviewed the Medicare Annual Wellness questionnaire and have noted 1. The patient's medical and social history 2. Their use of alcohol, tobacco or illicit drugs 3. Their current medications and supplements 4. The patient's functional ability including ADL's, fall risks, home safety risks and hearing or visual             impairment. 5. Diet and physical activities 6. Evidence for depression or mood disorders  The patients weight, height, BMI and visual acuity have been recorded in the chart I have made referrals, counseling and provided education to the patient based review of the above and I have provided the pt with a written personalized care plan for preventive services.  I have provided you with a copy of your personalized plan for preventive services. Please take the time to review along with your updated medication list.  prevnar today No cancer screening due to age (had colon 5 years ago) Discussed fitness Yearly flu shot
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
## 2014-09-11 NOTE — Assessment & Plan Note (Signed)
Better but still grieves his wife Will continue the med

## 2014-09-11 NOTE — Patient Instructions (Signed)
DASH Eating Plan °DASH stands for "Dietary Approaches to Stop Hypertension." The DASH eating plan is a healthy eating plan that has been shown to reduce high blood pressure (hypertension). Additional health benefits may include reducing the risk of type 2 diabetes mellitus, heart disease, and stroke. The DASH eating plan may also help with weight loss. °WHAT DO I NEED TO KNOW ABOUT THE DASH EATING PLAN? °For the DASH eating plan, you will follow these general guidelines: °· Choose foods with a percent daily value for sodium of less than 5% (as listed on the food label). °· Use salt-free seasonings or herbs instead of table salt or sea salt. °· Check with your health care provider or pharmacist before using salt substitutes. °· Eat lower-sodium products, often labeled as "lower sodium" or "no salt added." °· Eat fresh foods. °· Eat more vegetables, fruits, and low-fat dairy products. °· Choose whole grains. Look for the word "whole" as the first word in the ingredient list. °· Choose fish and skinless chicken or turkey more often than red meat. Limit fish, poultry, and meat to 6 oz (170 g) each day. °· Limit sweets, desserts, sugars, and sugary drinks. °· Choose heart-healthy fats. °· Limit cheese to 1 oz (28 g) per day. °· Eat more home-cooked food and less restaurant, buffet, and fast food. °· Limit fried foods. °· Cook foods using methods other than frying. °· Limit canned vegetables. If you do use them, rinse them well to decrease the sodium. °· When eating at a restaurant, ask that your food be prepared with less salt, or no salt if possible. °WHAT FOODS CAN I EAT? °Seek help from a dietitian for individual calorie needs. °Grains °Whole grain or whole wheat bread. Brown rice. Whole grain or whole wheat pasta. Quinoa, bulgur, and whole grain cereals. Low-sodium cereals. Corn or whole wheat flour tortillas. Whole grain cornbread. Whole grain crackers. Low-sodium crackers. °Vegetables °Fresh or frozen vegetables  (raw, steamed, roasted, or grilled). Low-sodium or reduced-sodium tomato and vegetable juices. Low-sodium or reduced-sodium tomato sauce and paste. Low-sodium or reduced-sodium canned vegetables.  °Fruits °All fresh, canned (in natural juice), or frozen fruits. °Meat and Other Protein Products °Ground beef (85% or leaner), grass-fed beef, or beef trimmed of fat. Skinless chicken or turkey. Ground chicken or turkey. Pork trimmed of fat. All fish and seafood. Eggs. Dried beans, peas, or lentils. Unsalted nuts and seeds. Unsalted canned beans. °Dairy °Low-fat dairy products, such as skim or 1% milk, 2% or reduced-fat cheeses, low-fat ricotta or cottage cheese, or plain low-fat yogurt. Low-sodium or reduced-sodium cheeses. °Fats and Oils °Tub margarines without trans fats. Light or reduced-fat mayonnaise and salad dressings (reduced sodium). Avocado. Safflower, olive, or canola oils. Natural peanut or almond butter. °Other °Unsalted popcorn and pretzels. °The items listed above may not be a complete list of recommended foods or beverages. Contact your dietitian for more options. °WHAT FOODS ARE NOT RECOMMENDED? °Grains °White bread. White pasta. White rice. Refined cornbread. Bagels and croissants. Crackers that contain trans fat. °Vegetables °Creamed or fried vegetables. Vegetables in a cheese sauce. Regular canned vegetables. Regular canned tomato sauce and paste. Regular tomato and vegetable juices. °Fruits °Dried fruits. Canned fruit in light or heavy syrup. Fruit juice. °Meat and Other Protein Products °Fatty cuts of meat. Ribs, chicken wings, bacon, sausage, bologna, salami, chitterlings, fatback, hot dogs, bratwurst, and packaged luncheon meats. Salted nuts and seeds. Canned beans with salt. °Dairy °Whole or 2% milk, cream, half-and-half, and cream cheese. Whole-fat or sweetened yogurt. Full-fat   cheeses or blue cheese. Nondairy creamers and whipped toppings. Processed cheese, cheese spreads, or cheese  curds. °Condiments °Onion and garlic salt, seasoned salt, table salt, and sea salt. Canned and packaged gravies. Worcestershire sauce. Tartar sauce. Barbecue sauce. Teriyaki sauce. Soy sauce, including reduced sodium. Steak sauce. Fish sauce. Oyster sauce. Cocktail sauce. Horseradish. Ketchup and mustard. Meat flavorings and tenderizers. Bouillon cubes. Hot sauce. Tabasco sauce. Marinades. Taco seasonings. Relishes. °Fats and Oils °Butter, stick margarine, lard, shortening, ghee, and bacon fat. Coconut, palm kernel, or palm oils. Regular salad dressings. °Other °Pickles and olives. Salted popcorn and pretzels. °The items listed above may not be a complete list of foods and beverages to avoid. Contact your dietitian for more information. °WHERE CAN I FIND MORE INFORMATION? °National Heart, Lung, and [employer] Institute: www.nhlbi.nih.gov/health/health-topics/topics/dash/ °Document Released: 02/19/2011 Document Revised: 07/17/2013 Document Reviewed: 01/04/2013 °ExitCare® Patient Information ©2015 [employer]. This information is not intended to replace advice given to you by your health care provider. Make sure you discuss any questions you have with your health care provider. ° °

## 2014-09-26 DIAGNOSIS — H6063 Unspecified chronic otitis externa, bilateral: Secondary | ICD-10-CM | POA: Diagnosis not present

## 2014-10-29 DIAGNOSIS — H6063 Unspecified chronic otitis externa, bilateral: Secondary | ICD-10-CM | POA: Diagnosis not present

## 2014-10-29 DIAGNOSIS — H6060 Unspecified chronic otitis externa, unspecified ear: Secondary | ICD-10-CM | POA: Diagnosis not present

## 2014-11-23 ENCOUNTER — Ambulatory Visit (INDEPENDENT_AMBULATORY_CARE_PROVIDER_SITE_OTHER): Payer: Medicare Other | Admitting: Internal Medicine

## 2014-11-23 ENCOUNTER — Encounter: Payer: Self-pay | Admitting: Internal Medicine

## 2014-11-23 VITALS — BP 110/60 | HR 70 | Temp 98.4°F | Wt 255.0 lb

## 2014-11-23 DIAGNOSIS — Z23 Encounter for immunization: Secondary | ICD-10-CM | POA: Diagnosis not present

## 2014-11-23 DIAGNOSIS — K5900 Constipation, unspecified: Secondary | ICD-10-CM | POA: Insufficient documentation

## 2014-11-23 DIAGNOSIS — K5901 Slow transit constipation: Secondary | ICD-10-CM | POA: Diagnosis not present

## 2014-11-23 NOTE — Patient Instructions (Signed)
Please start miralax 1 capful in full glass of water twice a day. Also take senna-s, 2 tabs daily. If you have not cleared out well in 3-4 days, you can try a biscodyl tab or suppository--or a Fleet's enema. Once you have cleared out, continue the miralax once a day--and adjust the dose to achieve regular bowel movement.

## 2014-11-23 NOTE — Addendum Note (Signed)
Addended by: Despina Hidden on: 11/23/2014 10:32 AM   Modules accepted: Orders
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
## 2014-11-23 NOTE — Progress Notes (Signed)
Pre visit review using our clinic review tool, if applicable. No additional management support is needed unless otherwise documented below in the visit note. 

## 2014-11-23 NOTE — Assessment & Plan Note (Signed)
New problem for him No meds to cause problems Instructed on regimen and can adjust via email on MyChart

## 2014-11-26 DIAGNOSIS — B369 Superficial mycosis, unspecified: Secondary | ICD-10-CM | POA: Diagnosis not present

## 2014-11-26 DIAGNOSIS — H6243 Otitis externa in other diseases classified elsewhere, bilateral: Secondary | ICD-10-CM | POA: Diagnosis not present

## 2014-12-03 DIAGNOSIS — H6063 Unspecified chronic otitis externa, bilateral: Secondary | ICD-10-CM | POA: Diagnosis not present

## 2014-12-05 ENCOUNTER — Encounter: Payer: Self-pay | Admitting: Internal Medicine

## 2014-12-20 DIAGNOSIS — H6093 Unspecified otitis externa, bilateral: Secondary | ICD-10-CM | POA: Diagnosis not present

## 2015-01-08 DIAGNOSIS — M75121 Complete rotator cuff tear or rupture of right shoulder, not specified as traumatic: Secondary | ICD-10-CM | POA: Diagnosis not present

## 2015-01-08 DIAGNOSIS — H6693 Otitis media, unspecified, bilateral: Secondary | ICD-10-CM | POA: Diagnosis not present

## 2015-01-08 DIAGNOSIS — H903 Sensorineural hearing loss, bilateral: Secondary | ICD-10-CM | POA: Diagnosis not present

## 2015-01-27 ENCOUNTER — Ambulatory Visit (INDEPENDENT_AMBULATORY_CARE_PROVIDER_SITE_OTHER): Payer: Medicare Other | Admitting: Family Medicine

## 2015-01-27 VITALS — BP 142/62 | HR 83 | Temp 98.7°F | Resp 20 | Ht 70.0 in | Wt 256.0 lb

## 2015-01-27 DIAGNOSIS — J209 Acute bronchitis, unspecified: Secondary | ICD-10-CM

## 2015-01-27 NOTE — Progress Notes (Signed)
74 yo retired man who presents with 2 weeks of cough, worsening, and associated with wheezing.  Unable to sleep well because of the cough. No chest pain or shortness of breath Former smoker No hemoptysis Some sinus congestion as well No N, V, D or headache No fever PMHx and FHx reviewed with patient.  Objective:  Heavy set elderly man in NAD BP 142/62 mmHg  Pulse 83  Temp(Src) 98.7 F (37.1 C) (Oral)  Resp 20  Ht 5\' 10"  (1.778 m)  Wt 256 lb (116.121 kg)  BMI 36.73 kg/m2  SpO2 96% HEENT: mild pharyngeal erythema, otherwise neg Neck: supple, no thyromegaly or adenopathy Chest:  Bilateral ronchi and wheezes on insp and exp Heart:  Reg, no murmur Skin: no rash  Assessment:  Acute bronchitis without signs of pneumonia  Plan:  Z-pak and tussionex 100 ml for bid use prn Call if not significantly better in 48 hours.  Robyn Haber, MD

## 2015-02-11 DIAGNOSIS — H6063 Unspecified chronic otitis externa, bilateral: Secondary | ICD-10-CM | POA: Diagnosis not present

## 2015-02-25 DIAGNOSIS — H6063 Unspecified chronic otitis externa, bilateral: Secondary | ICD-10-CM | POA: Diagnosis not present

## 2015-03-19 ENCOUNTER — Other Ambulatory Visit: Payer: Self-pay | Admitting: Family Medicine

## 2015-03-19 ENCOUNTER — Other Ambulatory Visit: Payer: Self-pay | Admitting: Internal Medicine

## 2015-03-19 DIAGNOSIS — H60503 Unspecified acute noninfective otitis externa, bilateral: Secondary | ICD-10-CM | POA: Diagnosis not present

## 2015-03-19 NOTE — Telephone Encounter (Signed)
Received refill request electronically Last refill 07/17/14 #90, last office visit 11/23/14 Is it okay to refill?

## 2015-03-19 NOTE — Telephone Encounter (Signed)
Rx called in as prescribed 

## 2015-03-19 NOTE — Telephone Encounter (Signed)
Px written for call in   

## 2015-03-20 MED ORDER — HYDROCODONE-ACETAMINOPHEN 5-500 MG PO CAPS: 1.0000 | ORAL_CAPSULE | Freq: Four times a day (QID) | ORAL | Status: DC | PRN

## 2015-03-20 MED ORDER — SERTRALINE HCL 50 MG PO TABS: 50.0000 mg | ORAL_TABLET | Freq: Every day | ORAL | Status: DC

## 2015-03-20 MED ORDER — PROMETHAZINE HCL 25 MG PO TABS: 25.0000 mg | ORAL_TABLET | Freq: Three times a day (TID) | ORAL | Status: DC | PRN

## 2015-03-20 MED ORDER — FUROSEMIDE 40 MG PO TABS: 40.0000 mg | ORAL_TABLET | Freq: Two times a day (BID) | ORAL | Status: DC | PRN

## 2015-03-20 NOTE — Telephone Encounter (Signed)
Diazepam was called in yesterday, removed from request Pt said he is still taking the hydrocodone and sertraline but they are on his past meds because the Rx has expired but he was only taking 1/2 to 1/4 tab of med so one Rx would last him around 5-6 months, but both meds are on his past med list and he is requesting a refill for all of the meds listed, please advise

## 2015-03-20 NOTE — Telephone Encounter (Signed)
See mychart comments, pt also is requesting a refill of hydrocodone 325mg , and sertraline 50mg , and both are not on med list, pt has CPE scheduled for 09/13/14

## 2015-03-20 NOTE — Telephone Encounter (Signed)
Pt notified Rxs sent to pharmacy and advise hydrocodone ready for pick-up

## 2015-03-20 NOTE — Telephone Encounter (Signed)
Refilled times one in PCP's absence

## 2015-03-20 NOTE — Telephone Encounter (Signed)
Since his PCP is out-please check in with him about the medications not on his list -and see what the deal is  Thanks

## 2015-03-26 DIAGNOSIS — H6062 Unspecified chronic otitis externa, left ear: Secondary | ICD-10-CM | POA: Diagnosis not present

## 2015-04-30 DIAGNOSIS — H6121 Impacted cerumen, right ear: Secondary | ICD-10-CM | POA: Diagnosis not present

## 2015-04-30 DIAGNOSIS — H906 Mixed conductive and sensorineural hearing loss, bilateral: Secondary | ICD-10-CM | POA: Insufficient documentation

## 2015-04-30 DIAGNOSIS — Z974 Presence of external hearing-aid: Secondary | ICD-10-CM | POA: Diagnosis not present

## 2015-04-30 DIAGNOSIS — H6062 Unspecified chronic otitis externa, left ear: Secondary | ICD-10-CM | POA: Diagnosis not present

## 2015-05-28 DIAGNOSIS — H6123 Impacted cerumen, bilateral: Secondary | ICD-10-CM | POA: Diagnosis not present

## 2015-05-28 DIAGNOSIS — H6062 Unspecified chronic otitis externa, left ear: Secondary | ICD-10-CM | POA: Diagnosis not present

## 2015-05-28 DIAGNOSIS — Z974 Presence of external hearing-aid: Secondary | ICD-10-CM | POA: Diagnosis not present

## 2015-05-28 DIAGNOSIS — H906 Mixed conductive and sensorineural hearing loss, bilateral: Secondary | ICD-10-CM | POA: Diagnosis not present

## 2015-06-17 DIAGNOSIS — H906 Mixed conductive and sensorineural hearing loss, bilateral: Secondary | ICD-10-CM | POA: Diagnosis not present

## 2015-06-17 DIAGNOSIS — Z974 Presence of external hearing-aid: Secondary | ICD-10-CM | POA: Diagnosis not present

## 2015-06-17 DIAGNOSIS — B369 Superficial mycosis, unspecified: Secondary | ICD-10-CM | POA: Diagnosis not present

## 2015-06-17 DIAGNOSIS — H6121 Impacted cerumen, right ear: Secondary | ICD-10-CM | POA: Diagnosis not present

## 2015-06-17 DIAGNOSIS — H6242 Otitis externa in other diseases classified elsewhere, left ear: Secondary | ICD-10-CM | POA: Diagnosis not present

## 2015-06-21 ENCOUNTER — Ambulatory Visit (INDEPENDENT_AMBULATORY_CARE_PROVIDER_SITE_OTHER): Payer: PPO | Admitting: Internal Medicine

## 2015-06-21 ENCOUNTER — Encounter: Payer: Self-pay | Admitting: Internal Medicine

## 2015-06-21 VITALS — BP 124/70 | HR 57 | Temp 97.4°F | Resp 12 | Wt 253.0 lb

## 2015-06-21 DIAGNOSIS — J0100 Acute maxillary sinusitis, unspecified: Secondary | ICD-10-CM | POA: Insufficient documentation

## 2015-06-21 MED ORDER — AMOXICILLIN 500 MG PO TABS: 1000.0000 mg | ORAL_TABLET | Freq: Two times a day (BID) | ORAL | Status: DC

## 2015-06-21 NOTE — Assessment & Plan Note (Signed)
Sick for 10 days and worse Not really in chest--?feels it from drainage Discussed supportive care Likely still viral Rx to fill if worsens
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
## 2015-06-21 NOTE — Patient Instructions (Signed)
Please start the antibiotic if you are worsening over the next few days 

## 2015-06-21 NOTE — Progress Notes (Signed)
Pre visit review using our clinic review tool, if applicable. No additional management support is needed unless otherwise documented below in the visit note. 

## 2015-06-26 DIAGNOSIS — M75121 Complete rotator cuff tear or rupture of right shoulder, not specified as traumatic: Secondary | ICD-10-CM | POA: Diagnosis not present

## 2015-06-26 DIAGNOSIS — M19011 Primary osteoarthritis, right shoulder: Secondary | ICD-10-CM | POA: Diagnosis not present

## 2015-06-26 DIAGNOSIS — M7541 Impingement syndrome of right shoulder: Secondary | ICD-10-CM | POA: Diagnosis not present

## 2015-06-26 DIAGNOSIS — M79642 Pain in left hand: Secondary | ICD-10-CM | POA: Diagnosis not present

## 2015-07-09 DIAGNOSIS — B369 Superficial mycosis, unspecified: Secondary | ICD-10-CM | POA: Diagnosis not present

## 2015-07-09 DIAGNOSIS — H6243 Otitis externa in other diseases classified elsewhere, bilateral: Secondary | ICD-10-CM | POA: Diagnosis not present

## 2015-07-09 DIAGNOSIS — H906 Mixed conductive and sensorineural hearing loss, bilateral: Secondary | ICD-10-CM | POA: Diagnosis not present

## 2015-07-09 DIAGNOSIS — H6240 Otitis externa in other diseases classified elsewhere, unspecified ear: Secondary | ICD-10-CM

## 2015-07-09 DIAGNOSIS — Z974 Presence of external hearing-aid: Secondary | ICD-10-CM | POA: Diagnosis not present

## 2015-07-31 DIAGNOSIS — Z974 Presence of external hearing-aid: Secondary | ICD-10-CM | POA: Diagnosis not present

## 2015-07-31 DIAGNOSIS — H906 Mixed conductive and sensorineural hearing loss, bilateral: Secondary | ICD-10-CM | POA: Diagnosis not present

## 2015-07-31 DIAGNOSIS — B369 Superficial mycosis, unspecified: Secondary | ICD-10-CM | POA: Diagnosis not present

## 2015-07-31 DIAGNOSIS — H6243 Otitis externa in other diseases classified elsewhere, bilateral: Secondary | ICD-10-CM | POA: Diagnosis not present

## 2015-08-27 ENCOUNTER — Encounter: Payer: Self-pay | Admitting: Cardiovascular Disease

## 2015-08-27 ENCOUNTER — Ambulatory Visit (INDEPENDENT_AMBULATORY_CARE_PROVIDER_SITE_OTHER): Payer: PPO | Admitting: Cardiovascular Disease

## 2015-08-27 VITALS — BP 140/66 | HR 60 | Ht 70.0 in | Wt 249.8 lb

## 2015-08-27 DIAGNOSIS — R0602 Shortness of breath: Secondary | ICD-10-CM

## 2015-08-27 DIAGNOSIS — I2510 Atherosclerotic heart disease of native coronary artery without angina pectoris: Secondary | ICD-10-CM | POA: Diagnosis not present

## 2015-08-27 NOTE — Patient Instructions (Signed)
Medication Instructions:  Your physician recommends that you continue on your current medications as directed. Please refer to the Current Medication list given to you today.  Please take your afternoon dosage of Furosemide around 2:00 PM  Labwork: No new orders.   Testing/Procedures: No new orders.   Follow-Up: Your physician wants you to follow-up in: 1 YEAR with [name].  You will receive a reminder letter in the mail two months in advance. If you don't receive a letter, please call our office to schedule the follow-up appointment.   Any Other Special Instructions Will Be Listed Below (If Applicable).     If you need a refill on your cardiac medications before your next appointment, please call your pharmacy.
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
## 2015-09-02 DIAGNOSIS — B369 Superficial mycosis, unspecified: Secondary | ICD-10-CM | POA: Diagnosis not present

## 2015-09-02 DIAGNOSIS — Z87891 Personal history of nicotine dependence: Secondary | ICD-10-CM | POA: Diagnosis not present

## 2015-09-02 DIAGNOSIS — Z974 Presence of external hearing-aid: Secondary | ICD-10-CM | POA: Diagnosis not present

## 2015-09-02 DIAGNOSIS — Z8669 Personal history of other diseases of the nervous system and sense organs: Secondary | ICD-10-CM | POA: Diagnosis not present

## 2015-09-02 DIAGNOSIS — H906 Mixed conductive and sensorineural hearing loss, bilateral: Secondary | ICD-10-CM | POA: Diagnosis not present

## 2015-09-02 DIAGNOSIS — H6243 Otitis externa in other diseases classified elsewhere, bilateral: Secondary | ICD-10-CM | POA: Diagnosis not present

## 2015-09-13 ENCOUNTER — Ambulatory Visit (INDEPENDENT_AMBULATORY_CARE_PROVIDER_SITE_OTHER): Payer: PPO | Admitting: Internal Medicine

## 2015-09-13 ENCOUNTER — Encounter: Payer: Self-pay | Admitting: Internal Medicine

## 2015-09-13 VITALS — BP 118/70 | HR 55 | Temp 97.4°F | Ht 69.5 in | Wt 244.0 lb

## 2015-09-13 DIAGNOSIS — I10 Essential (primary) hypertension: Secondary | ICD-10-CM

## 2015-09-13 DIAGNOSIS — M792 Neuralgia and neuritis, unspecified: Secondary | ICD-10-CM

## 2015-09-13 DIAGNOSIS — F324 Major depressive disorder, single episode, in partial remission: Secondary | ICD-10-CM

## 2015-09-13 DIAGNOSIS — I5032 Chronic diastolic (congestive) heart failure: Secondary | ICD-10-CM | POA: Diagnosis not present

## 2015-09-13 DIAGNOSIS — E785 Hyperlipidemia, unspecified: Secondary | ICD-10-CM | POA: Diagnosis not present

## 2015-09-13 DIAGNOSIS — G629 Polyneuropathy, unspecified: Secondary | ICD-10-CM | POA: Diagnosis not present

## 2015-09-13 DIAGNOSIS — Z0000 Encounter for general adult medical examination without abnormal findings: Secondary | ICD-10-CM | POA: Diagnosis not present

## 2015-09-13 DIAGNOSIS — M069 Rheumatoid arthritis, unspecified: Secondary | ICD-10-CM

## 2015-09-13 LAB — CBC WITH DIFFERENTIAL/PLATELET
Basophils Absolute: 0.1 10*3/uL (ref 0.0–0.1)
Basophils Relative: 0.8 % (ref 0.0–3.0)
Eosinophils Absolute: 0.3 10*3/uL (ref 0.0–0.7)
Eosinophils Relative: 3.3 % (ref 0.0–5.0)
HCT: 47.1 % (ref 39.0–52.0)
Hemoglobin: 15.7 g/dL (ref 13.0–17.0)
Lymphocytes Relative: 28.6 % (ref 12.0–46.0)
Lymphs Abs: 2.5 10*3/uL (ref 0.7–4.0)
MCHC: 33.3 g/dL (ref 30.0–36.0)
MCV: 87.4 fl (ref 78.0–100.0)
Monocytes Absolute: 0.8 10*3/uL (ref 0.1–1.0)
Monocytes Relative: 9.7 % (ref 3.0–12.0)
Neutro Abs: 5 10*3/uL (ref 1.4–7.7)
Neutrophils Relative %: 57.6 % (ref 43.0–77.0)
Platelets: 233 10*3/uL (ref 150.0–400.0)
RBC: 5.39 Mil/uL (ref 4.22–5.81)
RDW: 15.9 % — ABNORMAL HIGH (ref 11.5–15.5)
WBC: 8.7 10*3/uL (ref 4.0–10.5)

## 2015-09-13 LAB — LIPID PANEL
Cholesterol: 184 mg/dL (ref 0–200)
HDL: 41.8 mg/dL (ref >39.00)
LDL CALC: 124 mg/dL — AB (ref 0–99)
NONHDL: 142.15
Total CHOL/HDL Ratio: 4
Triglycerides: 91 mg/dL (ref 0.0–149.0)
VLDL: 18.2 mg/dL (ref 0.0–40.0)

## 2015-09-13 LAB — COMPREHENSIVE METABOLIC PANEL
ALT: 17 U/L (ref 0–53)
AST: 19 U/L (ref 0–37)
Albumin: 4 g/dL (ref 3.5–5.2)
Alkaline Phosphatase: 82 U/L (ref 39–117)
BUN: 13 mg/dL (ref 6–23)
CALCIUM: 9.4 mg/dL (ref 8.4–10.5)
CHLORIDE: 102 meq/L (ref 96–112)
CO2: 31 meq/L (ref 19–32)
Creatinine, Ser: 1.25 mg/dL (ref 0.40–1.50)
GFR: 59.63 mL/min — AB (ref >60.00)
Glucose, Bld: 98 mg/dL (ref 70–99)
POTASSIUM: 4.2 meq/L (ref 3.5–5.1)
Sodium: 139 mEq/L (ref 135–145)
Total Bilirubin: 0.7 mg/dL (ref 0.2–1.2)
Total Protein: 7.5 g/dL (ref 6.0–8.3)

## 2015-09-13 MED ORDER — SERTRALINE HCL 50 MG PO TABS: 50.0000 mg | ORAL_TABLET | Freq: Every day | ORAL | Status: DC

## 2015-09-13 MED ORDER — GABAPENTIN 300 MG PO CAPS: 300.0000 mg | ORAL_CAPSULE | Freq: Every day | ORAL | Status: DC

## 2015-09-13 NOTE — Assessment & Plan Note (Signed)
Hasn't done well off the sertraline Will restart

## 2015-09-13 NOTE — Assessment & Plan Note (Signed)
Try the gabapentin at bedtime Needs to use a walking stick for safety

## 2015-09-13 NOTE — Assessment & Plan Note (Signed)
Seems to be burned out meds didn't help him (some of arthritis was probably gout when he had more symptoms)

## 2015-09-13 NOTE — Assessment & Plan Note (Signed)
Stable DOE Better with increased furosemide

## 2015-09-13 NOTE — Assessment & Plan Note (Signed)
BP Readings from Last 3 Encounters:  09/13/15 118/70  08/27/15 140/66  06/21/15 124/70   Good control
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
## 2015-09-13 NOTE — Progress Notes (Signed)
Pre visit review using our clinic review tool, if applicable. No additional management support is needed unless otherwise documented below in the visit note. 

## 2015-09-13 NOTE — Patient Instructions (Addendum)
Please try the gabapentin, 1 or 2 capsules at bedtime (for the pain and to help you sleep). Restart the sertraline daily  DASH Eating Plan DASH stands for "Dietary Approaches to Stop Hypertension." The DASH eating plan is a healthy eating plan that has been shown to reduce high blood pressure (hypertension). Additional health benefits may include reducing the risk of type 2 diabetes mellitus, heart disease, and stroke. The DASH eating plan may also help with weight loss. WHAT DO I NEED TO KNOW ABOUT THE DASH EATING PLAN? For the DASH eating plan, you will follow these general guidelines:  Choose foods with a percent daily value for sodium of less than 5% (as listed on the food label).  Use salt-free seasonings or herbs instead of table salt or sea salt.  Check with your health care provider or pharmacist before using salt substitutes.  Eat lower-sodium products, often labeled as "lower sodium" or "no salt added."  Eat fresh foods.  Eat more vegetables, fruits, and low-fat dairy products.  Choose whole grains. Look for the word "whole" as the first word in the ingredient list.  Choose fish and skinless chicken or [place] more often than red meat. Limit fish, poultry, and meat to 6 oz (170 g) each day.  Limit sweets, desserts, sugars, and sugary drinks.  Choose heart-healthy fats.  Limit cheese to 1 oz (28 g) per day.  Eat more home-cooked food and less restaurant, buffet, and fast food.  Limit fried foods.  Cook foods using methods other than frying.  Limit canned vegetables. If you do use them, rinse them well to decrease the sodium.  When eating at a restaurant, ask that your food be prepared with less salt, or no salt if possible. WHAT FOODS CAN I EAT? Seek help from a dietitian for individual calorie needs. Grains Whole grain or whole wheat bread. Brown rice. Whole grain or whole wheat pasta. Quinoa, bulgur, and whole grain cereals. Low-sodium cereals. Corn or whole wheat  flour tortillas. Whole grain cornbread. Whole grain crackers. Low-sodium crackers. Vegetables Fresh or frozen vegetables (raw, steamed, roasted, or grilled). Low-sodium or reduced-sodium tomato and vegetable juices. Low-sodium or reduced-sodium tomato sauce and paste. Low-sodium or reduced-sodium canned vegetables.  Fruits All fresh, canned (in natural juice), or frozen fruits. Meat and Other Protein Products Ground beef (85% or leaner), grass-fed beef, or beef trimmed of fat. Skinless chicken or [place]. Ground chicken or [place]. Pork trimmed of fat. All fish and seafood. Eggs. Dried beans, peas, or lentils. Unsalted nuts and seeds. Unsalted canned beans. Dairy Low-fat dairy products, such as skim or 1% milk, 2% or reduced-fat cheeses, low-fat ricotta or cottage cheese, or plain low-fat yogurt. Low-sodium or reduced-sodium cheeses. Fats and Oils Tub margarines without trans fats. Light or reduced-fat mayonnaise and salad dressings (reduced sodium). Avocado. Safflower, olive, or canola oils. Natural peanut or almond butter. Other Unsalted popcorn and pretzels. The items listed above may not be a complete list of recommended foods or beverages. Contact your dietitian for more options. WHAT FOODS ARE NOT RECOMMENDED? Grains White bread. White pasta. White rice. Refined cornbread. Bagels and croissants. Crackers that contain trans fat. Vegetables Creamed or fried vegetables. Vegetables in a cheese sauce. Regular canned vegetables. Regular canned tomato sauce and paste. Regular tomato and vegetable juices. Fruits Dried fruits. Canned fruit in light or heavy syrup. Fruit juice. Meat and Other Protein Products Fatty cuts of meat. Ribs, chicken wings, bacon, sausage, bologna, salami, chitterlings, fatback, hot dogs, bratwurst, and packaged luncheon meats.  Salted nuts and seeds. Canned beans with salt. Dairy Whole or 2% milk, cream, half-and-half, and cream cheese. Whole-fat or sweetened yogurt.  Full-fat cheeses or blue cheese. Nondairy creamers and whipped toppings. Processed cheese, cheese spreads, or cheese curds. Condiments Onion and garlic salt, seasoned salt, table salt, and sea salt. Canned and packaged gravies. Worcestershire sauce. Tartar sauce. Barbecue sauce. Teriyaki sauce. Soy sauce, including reduced sodium. Steak sauce. Fish sauce. Oyster sauce. Cocktail sauce. Horseradish. Ketchup and mustard. Meat flavorings and tenderizers. Bouillon cubes. Hot sauce. Tabasco sauce. Marinades. Taco seasonings. Relishes. Fats and Oils Butter, stick margarine, lard, shortening, ghee, and bacon fat. Coconut, palm kernel, or palm oils. Regular salad dressings. Other Pickles and olives. Salted popcorn and pretzels. The items listed above may not be a complete list of foods and beverages to avoid. Contact your dietitian for more information. WHERE CAN I FIND MORE INFORMATION? National Heart, Lung, and Blood Institute: travelstabloid.com   This information is not intended to replace advice given to you by your health care provider. Make sure you discuss any questions you have with your health care provider.   Document Released: 02/19/2011 Document Revised: 03/23/2014 Document Reviewed: 01/04/2013 Elsevier Interactive Patient Education [employer].

## 2015-09-13 NOTE — Assessment & Plan Note (Signed)
I have personally reviewed the Medicare Annual Wellness questionnaire and have noted 1. The patient's medical and social history 2. Their use of alcohol, tobacco or illicit drugs 3. Their current medications and supplements 4. The patient's functional ability including ADL's, fall risks, home safety risks and hearing or visual             impairment. 5. Diet and physical activities 6. Evidence for depression or mood disorders  The patients weight, height, BMI and visual acuity have been recorded in the chart I have made referrals, counseling and provided education to the patient based review of the above and I have provided the pt with a written personalized care plan for preventive services.  I have provided you with a copy of your personalized plan for preventive services. Please take the time to review along with your updated medication list.  No cancer screening due to age Yearly flu vaccine

## 2015-09-13 NOTE — Assessment & Plan Note (Signed)
Intolerant of statins

## 2015-09-18 DIAGNOSIS — H6242 Otitis externa in other diseases classified elsewhere, left ear: Secondary | ICD-10-CM | POA: Diagnosis not present

## 2015-09-18 DIAGNOSIS — Z974 Presence of external hearing-aid: Secondary | ICD-10-CM | POA: Diagnosis not present

## 2015-09-18 DIAGNOSIS — B369 Superficial mycosis, unspecified: Secondary | ICD-10-CM | POA: Diagnosis not present

## 2015-09-18 DIAGNOSIS — H6121 Impacted cerumen, right ear: Secondary | ICD-10-CM | POA: Diagnosis not present

## 2015-09-18 DIAGNOSIS — H6123 Impacted cerumen, bilateral: Secondary | ICD-10-CM | POA: Insufficient documentation

## 2015-09-18 DIAGNOSIS — H906 Mixed conductive and sensorineural hearing loss, bilateral: Secondary | ICD-10-CM | POA: Diagnosis not present

## 2015-10-28 DIAGNOSIS — H6123 Impacted cerumen, bilateral: Secondary | ICD-10-CM | POA: Diagnosis not present

## 2015-10-28 DIAGNOSIS — Z974 Presence of external hearing-aid: Secondary | ICD-10-CM | POA: Diagnosis not present

## 2015-10-28 DIAGNOSIS — H906 Mixed conductive and sensorineural hearing loss, bilateral: Secondary | ICD-10-CM | POA: Diagnosis not present

## 2015-10-28 DIAGNOSIS — Z87891 Personal history of nicotine dependence: Secondary | ICD-10-CM | POA: Diagnosis not present

## 2015-10-28 DIAGNOSIS — Z8669 Personal history of other diseases of the nervous system and sense organs: Secondary | ICD-10-CM | POA: Diagnosis not present

## 2015-11-12 DIAGNOSIS — M65312 Trigger thumb, left thumb: Secondary | ICD-10-CM | POA: Diagnosis not present

## 2015-11-12 DIAGNOSIS — M65341 Trigger finger, right ring finger: Secondary | ICD-10-CM | POA: Diagnosis not present

## 2015-11-12 DIAGNOSIS — M65322 Trigger finger, left index finger: Secondary | ICD-10-CM | POA: Diagnosis not present

## 2015-11-12 DIAGNOSIS — M65331 Trigger finger, right middle finger: Secondary | ICD-10-CM | POA: Diagnosis not present

## 2015-11-26 DIAGNOSIS — H906 Mixed conductive and sensorineural hearing loss, bilateral: Secondary | ICD-10-CM | POA: Diagnosis not present

## 2015-11-26 DIAGNOSIS — Z87891 Personal history of nicotine dependence: Secondary | ICD-10-CM | POA: Diagnosis not present

## 2015-11-26 DIAGNOSIS — H6123 Impacted cerumen, bilateral: Secondary | ICD-10-CM | POA: Diagnosis not present

## 2015-11-26 DIAGNOSIS — Z974 Presence of external hearing-aid: Secondary | ICD-10-CM | POA: Diagnosis not present

## 2015-11-27 ENCOUNTER — Ambulatory Visit (INDEPENDENT_AMBULATORY_CARE_PROVIDER_SITE_OTHER): Payer: PPO

## 2015-11-27 DIAGNOSIS — Z23 Encounter for immunization: Secondary | ICD-10-CM

## 2015-12-17 DIAGNOSIS — Z974 Presence of external hearing-aid: Secondary | ICD-10-CM | POA: Diagnosis not present

## 2015-12-17 DIAGNOSIS — H6121 Impacted cerumen, right ear: Secondary | ICD-10-CM | POA: Insufficient documentation

## 2015-12-17 DIAGNOSIS — Z87891 Personal history of nicotine dependence: Secondary | ICD-10-CM | POA: Diagnosis not present

## 2015-12-17 DIAGNOSIS — H6063 Unspecified chronic otitis externa, bilateral: Secondary | ICD-10-CM | POA: Insufficient documentation

## 2015-12-17 DIAGNOSIS — Z8669 Personal history of other diseases of the nervous system and sense organs: Secondary | ICD-10-CM | POA: Diagnosis not present

## 2015-12-17 DIAGNOSIS — H906 Mixed conductive and sensorineural hearing loss, bilateral: Secondary | ICD-10-CM | POA: Diagnosis not present

## 2015-12-17 DIAGNOSIS — H6062 Unspecified chronic otitis externa, left ear: Secondary | ICD-10-CM | POA: Diagnosis not present

## 2015-12-18 ENCOUNTER — Ambulatory Visit: Payer: PPO | Admitting: Internal Medicine

## 2016-01-04 ENCOUNTER — Other Ambulatory Visit: Payer: Self-pay | Admitting: Family Medicine

## 2016-01-06 NOTE — Telephone Encounter (Signed)
Rxs sent electronically.  

## 2016-01-06 NOTE — Telephone Encounter (Signed)
Rout Rx request to PCP, last filled on 03/20/15 #20 tabs with 0 refill

## 2016-01-06 NOTE — Telephone Encounter (Signed)
Approved:  Promethazine #20 x 0 Furosemide should be bid (not prn)---fill #180 x 3

## 2016-01-07 DIAGNOSIS — Z8669 Personal history of other diseases of the nervous system and sense organs: Secondary | ICD-10-CM | POA: Diagnosis not present

## 2016-01-07 DIAGNOSIS — Z87891 Personal history of nicotine dependence: Secondary | ICD-10-CM | POA: Diagnosis not present

## 2016-01-07 DIAGNOSIS — Z974 Presence of external hearing-aid: Secondary | ICD-10-CM | POA: Diagnosis not present

## 2016-01-07 DIAGNOSIS — H906 Mixed conductive and sensorineural hearing loss, bilateral: Secondary | ICD-10-CM | POA: Diagnosis not present

## 2016-01-07 DIAGNOSIS — H6063 Unspecified chronic otitis externa, bilateral: Secondary | ICD-10-CM | POA: Diagnosis not present

## 2016-01-28 DIAGNOSIS — H906 Mixed conductive and sensorineural hearing loss, bilateral: Secondary | ICD-10-CM | POA: Diagnosis not present

## 2016-01-28 DIAGNOSIS — B369 Superficial mycosis, unspecified: Secondary | ICD-10-CM | POA: Diagnosis not present

## 2016-01-28 DIAGNOSIS — H6241 Otitis externa in other diseases classified elsewhere, right ear: Secondary | ICD-10-CM | POA: Diagnosis not present

## 2016-01-28 DIAGNOSIS — Z974 Presence of external hearing-aid: Secondary | ICD-10-CM | POA: Diagnosis not present

## 2016-01-28 DIAGNOSIS — Z87891 Personal history of nicotine dependence: Secondary | ICD-10-CM | POA: Diagnosis not present

## 2016-02-19 DIAGNOSIS — H6243 Otitis externa in other diseases classified elsewhere, bilateral: Secondary | ICD-10-CM | POA: Diagnosis not present

## 2016-02-19 DIAGNOSIS — H906 Mixed conductive and sensorineural hearing loss, bilateral: Secondary | ICD-10-CM | POA: Diagnosis not present

## 2016-02-19 DIAGNOSIS — Z87891 Personal history of nicotine dependence: Secondary | ICD-10-CM | POA: Diagnosis not present

## 2016-02-19 DIAGNOSIS — B369 Superficial mycosis, unspecified: Secondary | ICD-10-CM | POA: Diagnosis not present

## 2016-02-19 DIAGNOSIS — Z974 Presence of external hearing-aid: Secondary | ICD-10-CM | POA: Diagnosis not present

## 2016-03-11 DIAGNOSIS — H906 Mixed conductive and sensorineural hearing loss, bilateral: Secondary | ICD-10-CM | POA: Diagnosis not present

## 2016-03-11 DIAGNOSIS — H6063 Unspecified chronic otitis externa, bilateral: Secondary | ICD-10-CM | POA: Diagnosis not present

## 2016-03-11 DIAGNOSIS — Z87891 Personal history of nicotine dependence: Secondary | ICD-10-CM | POA: Diagnosis not present

## 2016-03-11 DIAGNOSIS — Z974 Presence of external hearing-aid: Secondary | ICD-10-CM | POA: Diagnosis not present

## 2016-04-09 DIAGNOSIS — B369 Superficial mycosis, unspecified: Secondary | ICD-10-CM | POA: Diagnosis not present

## 2016-04-09 DIAGNOSIS — H6242 Otitis externa in other diseases classified elsewhere, left ear: Secondary | ICD-10-CM | POA: Diagnosis not present

## 2016-04-09 DIAGNOSIS — H906 Mixed conductive and sensorineural hearing loss, bilateral: Secondary | ICD-10-CM | POA: Diagnosis not present

## 2016-04-09 DIAGNOSIS — Z974 Presence of external hearing-aid: Secondary | ICD-10-CM | POA: Diagnosis not present

## 2016-04-09 DIAGNOSIS — Z87891 Personal history of nicotine dependence: Secondary | ICD-10-CM | POA: Diagnosis not present

## 2016-04-23 ENCOUNTER — Encounter: Payer: Self-pay | Admitting: Podiatry

## 2016-04-23 ENCOUNTER — Ambulatory Visit (INDEPENDENT_AMBULATORY_CARE_PROVIDER_SITE_OTHER): Payer: PPO | Admitting: Podiatry

## 2016-04-23 ENCOUNTER — Ambulatory Visit: Payer: PPO

## 2016-04-23 DIAGNOSIS — G5760 Lesion of plantar nerve, unspecified lower limb: Secondary | ICD-10-CM

## 2016-04-23 DIAGNOSIS — M778 Other enthesopathies, not elsewhere classified: Secondary | ICD-10-CM

## 2016-04-23 DIAGNOSIS — M779 Enthesopathy, unspecified: Principal | ICD-10-CM

## 2016-04-23 DIAGNOSIS — G629 Polyneuropathy, unspecified: Secondary | ICD-10-CM | POA: Diagnosis not present

## 2016-04-23 DIAGNOSIS — G588 Other specified mononeuropathies: Secondary | ICD-10-CM

## 2016-04-23 NOTE — Progress Notes (Signed)
He presents today after having not seen him since March 2016. He states that he has noticed recently that his neuropathy has starting to become more painful as he also has numbness and pain across the plantar aspect of the forefoot particularly along the lesser toes. He continues to take 300 mg of gabapentin twice daily.  Objective: Vital signs are stable he is alert and oriented 3. Pulses are palpable. Neurologic sensorium is intact. Deep tendon reflexes are intact. He has pain on palpation neuroma third interdigital space bilateral this is consistent with neuromas. He also has no other changes in physical exam.  Neuroma third interdigital space. Diabetic peripheral neuropathy.  Plan: I injected the neuromas today bilaterally with Kenalog and local anesthetic increase his gabapentin to 300 mg 3 times a day and I will follow-up with him in 3 months at which time his nails will be trimmed. We will also reevaluate his neuromas at that time.

## 2016-04-29 ENCOUNTER — Encounter: Payer: Self-pay | Admitting: Internal Medicine

## 2016-04-29 ENCOUNTER — Ambulatory Visit (INDEPENDENT_AMBULATORY_CARE_PROVIDER_SITE_OTHER)
Admission: RE | Admit: 2016-04-29 | Discharge: 2016-04-29 | Disposition: A | Discharge status: Home / Self Care | Payer: PPO | Source: Ambulatory Visit | Attending: Internal Medicine | Admitting: Internal Medicine

## 2016-04-29 ENCOUNTER — Ambulatory Visit (INDEPENDENT_AMBULATORY_CARE_PROVIDER_SITE_OTHER): Payer: PPO | Admitting: Internal Medicine

## 2016-04-29 VITALS — BP 110/62 | HR 85 | Temp 98.6°F | Resp 24 | Wt 232.0 lb

## 2016-04-29 DIAGNOSIS — R0602 Shortness of breath: Secondary | ICD-10-CM | POA: Diagnosis not present

## 2016-04-29 DIAGNOSIS — J45901 Unspecified asthma with (acute) exacerbation: Secondary | ICD-10-CM

## 2016-04-29 DIAGNOSIS — R05 Cough: Secondary | ICD-10-CM | POA: Diagnosis not present

## 2016-04-29 DIAGNOSIS — J0110 Acute frontal sinusitis, unspecified: Secondary | ICD-10-CM

## 2016-04-29 DIAGNOSIS — R079 Chest pain, unspecified: Secondary | ICD-10-CM | POA: Diagnosis not present

## 2016-04-29 MED ORDER — AMOXICILLIN 500 MG PO TABS: 1000.0000 mg | ORAL_TABLET | Freq: Two times a day (BID) | ORAL | 0 refills | Status: DC

## 2016-04-29 MED ORDER — PREDNISONE 20 MG PO TABS: 40.0000 mg | ORAL_TABLET | Freq: Every day | ORAL | 0 refills | Status: DC
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
## 2016-04-29 NOTE — Progress Notes (Signed)
Pre visit review using our clinic review tool, if applicable. No additional management support is needed unless otherwise documented below in the visit note. 

## 2016-04-29 NOTE — Assessment & Plan Note (Addendum)
Some help with albuterol Will check CXR to make sure no pneumonia This looks okay Will give 3 days of prednisone

## 2016-04-29 NOTE — Assessment & Plan Note (Signed)
CXR shows no apparent pneumonia Will treat with amoxil

## 2016-04-30 ENCOUNTER — Ambulatory Visit: Payer: PPO | Admitting: Family Medicine

## 2016-05-04 DIAGNOSIS — B369 Superficial mycosis, unspecified: Secondary | ICD-10-CM | POA: Diagnosis not present

## 2016-05-04 DIAGNOSIS — H906 Mixed conductive and sensorineural hearing loss, bilateral: Secondary | ICD-10-CM | POA: Diagnosis not present

## 2016-05-04 DIAGNOSIS — Z8669 Personal history of other diseases of the nervous system and sense organs: Secondary | ICD-10-CM | POA: Diagnosis not present

## 2016-05-04 DIAGNOSIS — Z974 Presence of external hearing-aid: Secondary | ICD-10-CM | POA: Diagnosis not present

## 2016-05-04 DIAGNOSIS — H6242 Otitis externa in other diseases classified elsewhere, left ear: Secondary | ICD-10-CM | POA: Diagnosis not present

## 2016-05-04 DIAGNOSIS — H6121 Impacted cerumen, right ear: Secondary | ICD-10-CM | POA: Diagnosis not present

## 2016-05-04 DIAGNOSIS — Z87891 Personal history of nicotine dependence: Secondary | ICD-10-CM | POA: Diagnosis not present

## 2016-05-05 ENCOUNTER — Telehealth: Payer: Self-pay

## 2016-05-05 MED ORDER — PREDNISONE 20 MG PO TABS: 40.0000 mg | ORAL_TABLET | Freq: Every day | ORAL | 0 refills | Status: DC

## 2016-05-05 MED ORDER — AMOXICILLIN-POT CLAVULANATE 875-125 MG PO TABS: 1.0000 | ORAL_TABLET | Freq: Two times a day (BID) | ORAL | 0 refills | Status: AC

## 2016-05-05 NOTE — Telephone Encounter (Signed)
Pt left v/m; pt was seen 04/29/16; pt has finished prescribed medicine and now cold has settled in chest and pt can hear rattling in his chest; pt wants to know  What else can be done. Pt request cb. [place].

## 2016-05-05 NOTE — Telephone Encounter (Signed)
Let him know that I sent a prescription for a stronger antibiotic and prednisone to go for 6 days If he is having trouble breathing, he needs to be rechecked

## 2016-05-05 NOTE — Telephone Encounter (Signed)
Spoke to pt. He said he is not having difficulty breathing.  He will let us know if he does.

## 2016-06-01 DIAGNOSIS — B369 Superficial mycosis, unspecified: Secondary | ICD-10-CM | POA: Diagnosis not present

## 2016-06-01 DIAGNOSIS — H6240 Otitis externa in other diseases classified elsewhere, unspecified ear: Secondary | ICD-10-CM | POA: Diagnosis not present

## 2016-06-01 DIAGNOSIS — H906 Mixed conductive and sensorineural hearing loss, bilateral: Secondary | ICD-10-CM | POA: Diagnosis not present

## 2016-06-08 DIAGNOSIS — M25511 Pain in right shoulder: Secondary | ICD-10-CM | POA: Diagnosis not present

## 2016-06-08 DIAGNOSIS — G8929 Other chronic pain: Secondary | ICD-10-CM | POA: Diagnosis not present

## 2016-06-08 DIAGNOSIS — M75121 Complete rotator cuff tear or rupture of right shoulder, not specified as traumatic: Secondary | ICD-10-CM | POA: Diagnosis not present

## 2016-06-24 DIAGNOSIS — H6063 Unspecified chronic otitis externa, bilateral: Secondary | ICD-10-CM | POA: Diagnosis not present

## 2016-06-24 DIAGNOSIS — H906 Mixed conductive and sensorineural hearing loss, bilateral: Secondary | ICD-10-CM | POA: Diagnosis not present

## 2016-06-24 DIAGNOSIS — Z974 Presence of external hearing-aid: Secondary | ICD-10-CM | POA: Diagnosis not present

## 2016-06-24 DIAGNOSIS — Z87891 Personal history of nicotine dependence: Secondary | ICD-10-CM | POA: Diagnosis not present

## 2016-06-24 DIAGNOSIS — Z8669 Personal history of other diseases of the nervous system and sense organs: Secondary | ICD-10-CM | POA: Diagnosis not present

## 2016-07-20 DIAGNOSIS — H6123 Impacted cerumen, bilateral: Secondary | ICD-10-CM | POA: Diagnosis not present

## 2016-07-20 DIAGNOSIS — H6243 Otitis externa in other diseases classified elsewhere, bilateral: Secondary | ICD-10-CM | POA: Diagnosis not present

## 2016-07-20 DIAGNOSIS — H903 Sensorineural hearing loss, bilateral: Secondary | ICD-10-CM | POA: Diagnosis not present

## 2016-07-20 DIAGNOSIS — Z87891 Personal history of nicotine dependence: Secondary | ICD-10-CM | POA: Diagnosis not present

## 2016-07-20 DIAGNOSIS — B369 Superficial mycosis, unspecified: Secondary | ICD-10-CM | POA: Diagnosis not present

## 2016-07-21 ENCOUNTER — Ambulatory Visit (INDEPENDENT_AMBULATORY_CARE_PROVIDER_SITE_OTHER): Payer: PPO | Admitting: Podiatry

## 2016-07-21 ENCOUNTER — Encounter: Payer: Self-pay | Admitting: Podiatry

## 2016-07-21 DIAGNOSIS — G588 Other specified mononeuropathies: Secondary | ICD-10-CM

## 2016-07-21 DIAGNOSIS — M79676 Pain in unspecified toe(s): Secondary | ICD-10-CM | POA: Diagnosis not present

## 2016-07-21 DIAGNOSIS — G629 Polyneuropathy, unspecified: Secondary | ICD-10-CM

## 2016-07-21 DIAGNOSIS — B351 Tinea unguium: Secondary | ICD-10-CM

## 2016-07-21 DIAGNOSIS — G5760 Lesion of plantar nerve, unspecified lower limb: Secondary | ICD-10-CM | POA: Diagnosis not present

## 2016-07-21 NOTE — Progress Notes (Signed)
He presents today chief complaint of painful elongated toenails and painful neuromas bilateral forefoot.  Objective: Vital signs are stable he is alert and oriented 3. Pulses are palpable. Palpable Mulder's click with pain on palpation to the third interdigital space bilaterally. His nails are long thick yellow dystrophic and clinically mycotic.  Assessment: Pain in limb secondary to neuroma bilateral. Also pain in limb secondary to onychomycosis.  Plan: Debridement all reactive hyperkeratosis today. Injected the bilateral third interdigital space dehydrated alcohol follow-up with him in 3 weeks.

## 2016-08-11 ENCOUNTER — Ambulatory Visit: Payer: PPO | Admitting: Podiatry

## 2016-08-11 DIAGNOSIS — Z87891 Personal history of nicotine dependence: Secondary | ICD-10-CM | POA: Diagnosis not present

## 2016-08-11 DIAGNOSIS — H6123 Impacted cerumen, bilateral: Secondary | ICD-10-CM | POA: Diagnosis not present

## 2016-08-11 DIAGNOSIS — H6063 Unspecified chronic otitis externa, bilateral: Secondary | ICD-10-CM | POA: Diagnosis not present

## 2016-08-11 DIAGNOSIS — Z974 Presence of external hearing-aid: Secondary | ICD-10-CM | POA: Diagnosis not present

## 2016-08-11 DIAGNOSIS — H906 Mixed conductive and sensorineural hearing loss, bilateral: Secondary | ICD-10-CM | POA: Diagnosis not present

## 2016-08-17 ENCOUNTER — Other Ambulatory Visit: Payer: Self-pay | Admitting: Family Medicine

## 2016-08-17 ENCOUNTER — Other Ambulatory Visit: Payer: Self-pay | Admitting: Internal Medicine

## 2016-08-17 NOTE — Telephone Encounter (Signed)
Left refill on voice mail at pharmacy  

## 2016-08-17 NOTE — Telephone Encounter (Signed)
Last filled 03-20-15 #90 Last OV Acute 04-29-16 Last CPE 09-13-15  No UDS

## 2016-08-17 NOTE — Telephone Encounter (Signed)
Approved: #[id number] x 0 

## 2016-08-20 ENCOUNTER — Ambulatory Visit (INDEPENDENT_AMBULATORY_CARE_PROVIDER_SITE_OTHER): Payer: PPO | Admitting: Podiatry

## 2016-08-20 ENCOUNTER — Encounter: Payer: Self-pay | Admitting: Podiatry

## 2016-08-20 DIAGNOSIS — G588 Other specified mononeuropathies: Secondary | ICD-10-CM

## 2016-08-20 DIAGNOSIS — G5760 Lesion of plantar nerve, unspecified lower limb: Secondary | ICD-10-CM

## 2016-08-20 NOTE — Progress Notes (Signed)
He presents today for follow-up of his neuroma third interdigital space bilateral. He states that he is much improved.  Objective: Vital signs are stable he is alert and 3 he has pain on palpation third interdigital space bilateral. Palpable click is noted third interdigital space bilateral.  Assessment: Pain limb secondary to neuroma third interdigital space bilateral foot.  Plan: I injected the bilateral space today with dehydrated alcohol follow-up with him in 3 weeks.

## 2016-09-08 DIAGNOSIS — H6243 Otitis externa in other diseases classified elsewhere, bilateral: Secondary | ICD-10-CM | POA: Diagnosis not present

## 2016-09-08 DIAGNOSIS — B369 Superficial mycosis, unspecified: Secondary | ICD-10-CM | POA: Diagnosis not present

## 2016-09-08 DIAGNOSIS — H6123 Impacted cerumen, bilateral: Secondary | ICD-10-CM | POA: Diagnosis not present

## 2016-09-08 DIAGNOSIS — H906 Mixed conductive and sensorineural hearing loss, bilateral: Secondary | ICD-10-CM | POA: Diagnosis not present

## 2016-09-08 DIAGNOSIS — Z87891 Personal history of nicotine dependence: Secondary | ICD-10-CM | POA: Diagnosis not present

## 2016-09-08 DIAGNOSIS — Z974 Presence of external hearing-aid: Secondary | ICD-10-CM | POA: Diagnosis not present

## 2016-10-05 DIAGNOSIS — H6243 Otitis externa in other diseases classified elsewhere, bilateral: Secondary | ICD-10-CM | POA: Diagnosis not present

## 2016-10-05 DIAGNOSIS — H6123 Impacted cerumen, bilateral: Secondary | ICD-10-CM | POA: Diagnosis not present

## 2016-10-05 DIAGNOSIS — Z87891 Personal history of nicotine dependence: Secondary | ICD-10-CM | POA: Diagnosis not present

## 2016-10-05 DIAGNOSIS — B369 Superficial mycosis, unspecified: Secondary | ICD-10-CM | POA: Diagnosis not present

## 2016-10-06 ENCOUNTER — Ambulatory Visit (INDEPENDENT_AMBULATORY_CARE_PROVIDER_SITE_OTHER): Payer: PPO | Admitting: Cardiovascular Disease

## 2016-10-06 ENCOUNTER — Encounter: Payer: Self-pay | Admitting: Cardiovascular Disease

## 2016-10-06 VITALS — BP 120/54 | HR 52 | Ht 72.0 in | Wt 227.4 lb

## 2016-10-06 DIAGNOSIS — I5032 Chronic diastolic (congestive) heart failure: Secondary | ICD-10-CM | POA: Diagnosis not present

## 2016-10-06 DIAGNOSIS — I2510 Atherosclerotic heart disease of native coronary artery without angina pectoris: Secondary | ICD-10-CM | POA: Diagnosis not present

## 2016-10-06 MED ORDER — ASPIRIN 81 MG PO TABS: 81.0000 mg | ORAL_TABLET | Freq: Every day | ORAL | Status: DC

## 2016-10-06 NOTE — Patient Instructions (Addendum)
Medication Instructions:  Your physician has recommended you make the following change in your medication:  1. STOP Metoprolol Tartrate 2. DECREASE Aspirin to 81mg  take one tablet by mouth daily  Labwork: No new orders.   Testing/Procedures: No new orders.   Follow-Up: Your physician wants you to follow-up in: 1 YEAR with [name]. You will receive a reminder letter in the mail two months in advance. If you don't receive a letter, please call our office to schedule the follow-up appointment.   Any Other Special Instructions Will Be Listed Below (If Applicable).     If you need a refill on your cardiac medications before your next appointment, please call your pharmacy.
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
## 2016-10-22 ENCOUNTER — Encounter: Payer: Self-pay | Admitting: Podiatry

## 2016-10-22 ENCOUNTER — Ambulatory Visit (INDEPENDENT_AMBULATORY_CARE_PROVIDER_SITE_OTHER): Payer: PPO | Admitting: Podiatry

## 2016-10-22 DIAGNOSIS — G5760 Lesion of plantar nerve, unspecified lower limb: Secondary | ICD-10-CM | POA: Diagnosis not present

## 2016-10-22 DIAGNOSIS — G588 Other specified mononeuropathies: Secondary | ICD-10-CM

## 2016-10-24 NOTE — Progress Notes (Signed)
He presents today for follow-up of his neuromas. He states that he needs to have an injection.  Objective: Vital signs are stable he is alert and oriented 3 pain on palpation third interdigital space bilaterally.  Assessment: Neuroma third interdigital space bilateral.  Plan: Injected another dose of dehydrated alcohol to the third interdigital space bilateral. Follow up with him in 3 weeks

## 2016-10-26 DIAGNOSIS — H6123 Impacted cerumen, bilateral: Secondary | ICD-10-CM | POA: Diagnosis not present

## 2016-10-26 DIAGNOSIS — Z974 Presence of external hearing-aid: Secondary | ICD-10-CM | POA: Diagnosis not present

## 2016-10-26 DIAGNOSIS — H6063 Unspecified chronic otitis externa, bilateral: Secondary | ICD-10-CM | POA: Diagnosis not present

## 2016-10-26 DIAGNOSIS — H906 Mixed conductive and sensorineural hearing loss, bilateral: Secondary | ICD-10-CM | POA: Diagnosis not present

## 2016-11-12 ENCOUNTER — Ambulatory Visit (INDEPENDENT_AMBULATORY_CARE_PROVIDER_SITE_OTHER): Payer: PPO | Admitting: Podiatry

## 2016-11-12 ENCOUNTER — Encounter: Payer: Self-pay | Admitting: Podiatry

## 2016-11-12 DIAGNOSIS — G5760 Lesion of plantar nerve, unspecified lower limb: Secondary | ICD-10-CM

## 2016-11-12 DIAGNOSIS — G588 Other specified mononeuropathies: Secondary | ICD-10-CM

## 2016-11-12 NOTE — Progress Notes (Signed)
Jonathan Glass presents today for follow-up of neuroma to the third interdigital space of the bilateral foot. He states it is starting to do better. He does states they're sore and he states my entire foot is starting to become more sore. Across the front.  Objective: Vital signs are stable he is alert and oriented 3. Pulses are palpable. His pain on palpation third interdigital space bilateral. Pain on palpation of all the message metatarsal heads. Rigid hammertoe deformities resulting in metatarsalgia by flexed metatarsals.  Assessment: Plantar flexed metatarsals with metatarsalgia and capsulitis. Hammertoe deformities. Neuromas third interdigital space.  Plan: Discussed etiology pathology conservative surgical therapies this point in time I reinjected with dehydrated alcohol bilaterally. I also asked [name] to place Plastazote insoles in his current shoes.

## 2016-11-17 DIAGNOSIS — H60393 Other infective otitis externa, bilateral: Secondary | ICD-10-CM | POA: Diagnosis not present

## 2016-11-17 DIAGNOSIS — H906 Mixed conductive and sensorineural hearing loss, bilateral: Secondary | ICD-10-CM | POA: Diagnosis not present

## 2016-11-26 ENCOUNTER — Ambulatory Visit: Payer: PPO

## 2016-12-03 ENCOUNTER — Ambulatory Visit: Payer: PPO | Admitting: Podiatry

## 2016-12-15 DIAGNOSIS — H6240 Otitis externa in other diseases classified elsewhere, unspecified ear: Secondary | ICD-10-CM | POA: Diagnosis not present

## 2016-12-15 DIAGNOSIS — B369 Superficial mycosis, unspecified: Secondary | ICD-10-CM | POA: Diagnosis not present

## 2016-12-15 DIAGNOSIS — H906 Mixed conductive and sensorineural hearing loss, bilateral: Secondary | ICD-10-CM | POA: Diagnosis not present

## 2016-12-28 DIAGNOSIS — G8929 Other chronic pain: Secondary | ICD-10-CM | POA: Diagnosis not present

## 2016-12-28 DIAGNOSIS — M25511 Pain in right shoulder: Secondary | ICD-10-CM | POA: Diagnosis not present

## 2016-12-28 DIAGNOSIS — M7541 Impingement syndrome of right shoulder: Secondary | ICD-10-CM | POA: Diagnosis not present

## 2016-12-28 DIAGNOSIS — M75121 Complete rotator cuff tear or rupture of right shoulder, not specified as traumatic: Secondary | ICD-10-CM | POA: Diagnosis not present

## 2016-12-29 ENCOUNTER — Encounter: Payer: Self-pay | Admitting: Podiatry

## 2016-12-29 ENCOUNTER — Ambulatory Visit (INDEPENDENT_AMBULATORY_CARE_PROVIDER_SITE_OTHER): Payer: PPO | Admitting: Podiatry

## 2016-12-29 DIAGNOSIS — G5760 Lesion of plantar nerve, unspecified lower limb: Secondary | ICD-10-CM | POA: Diagnosis not present

## 2016-12-29 DIAGNOSIS — G588 Other specified mononeuropathies: Secondary | ICD-10-CM

## 2016-12-29 NOTE — Progress Notes (Signed)
He presents today for follow-up neuroma third interdigital space bilaterally. He states the toes are feeling much better but still a lot of pain on the bottoms.  Objective: Vital signs stable he is alert and oriented 3. Pulses are palpable.  Pain on palpation third interdigital space bilateral palpable Mulder's click third interdigital space bilateral.  Assessment: Neuroma third interdigital space bilateral.  Plan: Reinjected another dose of dehydrated alcohol. This is bilateral injection follow-up with him 3 months

## 2017-01-05 DIAGNOSIS — B369 Superficial mycosis, unspecified: Secondary | ICD-10-CM | POA: Diagnosis not present

## 2017-01-05 DIAGNOSIS — H906 Mixed conductive and sensorineural hearing loss, bilateral: Secondary | ICD-10-CM | POA: Diagnosis not present

## 2017-01-05 DIAGNOSIS — H6240 Otitis externa in other diseases classified elsewhere, unspecified ear: Secondary | ICD-10-CM | POA: Diagnosis not present

## 2017-01-19 ENCOUNTER — Ambulatory Visit: Payer: PPO | Admitting: Podiatry

## 2017-01-27 ENCOUNTER — Other Ambulatory Visit: Payer: Self-pay | Admitting: Internal Medicine

## 2017-01-27 NOTE — Telephone Encounter (Signed)
Approved: #90 x 0 See if he is willing to set up Medicare wellness visit in the next few months

## 2017-01-27 NOTE — Telephone Encounter (Signed)
Left refill on voice mail at pharmacy  

## 2017-01-27 NOTE — Telephone Encounter (Signed)
Last filled 08-17-16 #90 Last OV Acute 04-29-16 Last MCW 09-13-15

## 2017-01-28 DIAGNOSIS — H906 Mixed conductive and sensorineural hearing loss, bilateral: Secondary | ICD-10-CM | POA: Diagnosis not present

## 2017-01-28 DIAGNOSIS — Z974 Presence of external hearing-aid: Secondary | ICD-10-CM | POA: Diagnosis not present

## 2017-01-28 DIAGNOSIS — H6243 Otitis externa in other diseases classified elsewhere, bilateral: Secondary | ICD-10-CM | POA: Diagnosis not present

## 2017-01-28 DIAGNOSIS — B369 Superficial mycosis, unspecified: Secondary | ICD-10-CM | POA: Diagnosis not present

## 2017-01-28 NOTE — Telephone Encounter (Signed)
I called the pt to try and set up his appt. I went out to February and was told the monthly allowed [facility] exams was reached and I cannot override that. He wants an early appointment and there were several days with only 4 physicals or [facility] exams. Can you help me get him scheduled?

## 2017-02-08 DIAGNOSIS — M12819 Other specific arthropathies, not elsewhere classified, unspecified shoulder: Secondary | ICD-10-CM | POA: Diagnosis not present

## 2017-02-08 DIAGNOSIS — M19011 Primary osteoarthritis, right shoulder: Secondary | ICD-10-CM | POA: Diagnosis not present

## 2017-02-08 DIAGNOSIS — M75100 Unspecified rotator cuff tear or rupture of unspecified shoulder, not specified as traumatic: Secondary | ICD-10-CM | POA: Diagnosis not present

## 2017-02-08 DIAGNOSIS — M75121 Complete rotator cuff tear or rupture of right shoulder, not specified as traumatic: Secondary | ICD-10-CM | POA: Diagnosis not present

## 2017-02-17 DIAGNOSIS — M12811 Other specific arthropathies, not elsewhere classified, right shoulder: Secondary | ICD-10-CM | POA: Diagnosis not present

## 2017-02-17 DIAGNOSIS — M19011 Primary osteoarthritis, right shoulder: Secondary | ICD-10-CM | POA: Diagnosis not present

## 2017-02-17 DIAGNOSIS — M75101 Unspecified rotator cuff tear or rupture of right shoulder, not specified as traumatic: Secondary | ICD-10-CM | POA: Diagnosis not present

## 2017-02-18 ENCOUNTER — Ambulatory Visit: Payer: PPO | Admitting: Podiatry

## 2017-02-23 ENCOUNTER — Ambulatory Visit: Payer: PPO | Admitting: Podiatry

## 2017-02-24 DIAGNOSIS — H60391 Other infective otitis externa, right ear: Secondary | ICD-10-CM | POA: Diagnosis not present

## 2017-02-24 DIAGNOSIS — H906 Mixed conductive and sensorineural hearing loss, bilateral: Secondary | ICD-10-CM | POA: Diagnosis not present

## 2017-02-26 ENCOUNTER — Encounter: Payer: Self-pay | Admitting: Cardiovascular Disease

## 2017-03-02 ENCOUNTER — Encounter: Payer: Self-pay | Admitting: Podiatry

## 2017-03-02 ENCOUNTER — Ambulatory Visit: Payer: PPO | Admitting: Podiatry

## 2017-03-02 DIAGNOSIS — G588 Other specified mononeuropathies: Secondary | ICD-10-CM

## 2017-03-02 DIAGNOSIS — G5760 Lesion of plantar nerve, unspecified lower limb: Secondary | ICD-10-CM | POA: Diagnosis not present

## 2017-03-02 NOTE — Progress Notes (Signed)
He presents today for follow-up of his neuroma to the third interdigital space bilaterally.  He states that his foot bilaterally is feeling much better however his toes are still bothersome.  Objective: Pulses remain palpable contracted digits resulting in plantar flexed metatarsals with pain on palpation and a palpable Mulder's click to the third interdigital space bilaterally.  Assessment: Hammertoe deformity plantar flexed metatarsals with neuroma third interdigital space bilateral neuropathy as well.  Plan: I reinjected third interdigital space today with 2 cc of 4% dehydrated alcohol.  This was injected bilaterally after sterile Betadine skin prep and verbal consent.  Tolerated procedure well will follow up with me in the near future.

## 2017-03-11 ENCOUNTER — Inpatient Hospital Stay (HOSPITAL_COMMUNITY): Admission: RE | Admit: 2017-03-11 | Payer: PPO | Source: Ambulatory Visit

## 2017-03-17 DIAGNOSIS — H906 Mixed conductive and sensorineural hearing loss, bilateral: Secondary | ICD-10-CM | POA: Diagnosis not present

## 2017-03-17 DIAGNOSIS — H6240 Otitis externa in other diseases classified elsewhere, unspecified ear: Secondary | ICD-10-CM | POA: Diagnosis not present

## 2017-03-17 DIAGNOSIS — B369 Superficial mycosis, unspecified: Secondary | ICD-10-CM | POA: Diagnosis not present

## 2017-03-18 ENCOUNTER — Encounter (HOSPITAL_COMMUNITY): Admission: RE | Payer: Self-pay | Source: Ambulatory Visit

## 2017-03-18 ENCOUNTER — Inpatient Hospital Stay (HOSPITAL_COMMUNITY): Admission: RE | Admit: 2017-03-18 | Payer: PPO | Source: Ambulatory Visit | Admitting: Orthopedic Surgery

## 2017-03-18 SURGERY — ARTHROPLASTY, SHOULDER, TOTAL, REVERSE
Anesthesia: General | Site: Shoulder | Laterality: Right

## 2017-03-24 ENCOUNTER — Ambulatory Visit (INDEPENDENT_AMBULATORY_CARE_PROVIDER_SITE_OTHER): Payer: PPO | Admitting: Orthopaedic Surgery

## 2017-03-24 ENCOUNTER — Ambulatory Visit (INDEPENDENT_AMBULATORY_CARE_PROVIDER_SITE_OTHER): Payer: PPO

## 2017-03-24 ENCOUNTER — Encounter (INDEPENDENT_AMBULATORY_CARE_PROVIDER_SITE_OTHER): Payer: Self-pay | Admitting: Orthopaedic Surgery

## 2017-03-24 DIAGNOSIS — M25511 Pain in right shoulder: Secondary | ICD-10-CM | POA: Diagnosis not present

## 2017-03-24 DIAGNOSIS — G8929 Other chronic pain: Secondary | ICD-10-CM
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
## 2017-03-25 ENCOUNTER — Ambulatory Visit: Payer: PPO | Admitting: Podiatry

## 2017-03-30 ENCOUNTER — Ambulatory Visit (INDEPENDENT_AMBULATORY_CARE_PROVIDER_SITE_OTHER): Payer: PPO

## 2017-03-30 ENCOUNTER — Ambulatory Visit (INDEPENDENT_AMBULATORY_CARE_PROVIDER_SITE_OTHER): Payer: PPO | Admitting: Physical Medicine and Rehabilitation

## 2017-03-30 ENCOUNTER — Ambulatory Visit: Payer: PPO | Admitting: Podiatry

## 2017-03-30 DIAGNOSIS — M25511 Pain in right shoulder: Secondary | ICD-10-CM | POA: Diagnosis not present

## 2017-03-30 DIAGNOSIS — G8929 Other chronic pain: Secondary | ICD-10-CM | POA: Diagnosis not present

## 2017-03-30 MED ORDER — TRIAMCINOLONE ACETONIDE 40 MG/ML IJ SUSP
80.0000 mg | INTRAMUSCULAR | Status: AC | PRN
  Administered 2017-03-30: 80 mg via INTRA_ARTICULAR

## 2017-03-30 MED ORDER — BUPIVACAINE HCL 0.5 % IJ SOLN
3.0000 mL | INTRAMUSCULAR | Status: AC | PRN
  Administered 2017-03-30: 3 mL via INTRA_ARTICULAR
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
## 2017-03-30 NOTE — Patient Instructions (Signed)

## 2017-04-06 ENCOUNTER — Encounter: Payer: Self-pay | Admitting: Podiatry

## 2017-04-06 ENCOUNTER — Ambulatory Visit: Payer: PPO | Admitting: Podiatry

## 2017-04-06 DIAGNOSIS — M109 Gout, unspecified: Secondary | ICD-10-CM

## 2017-04-06 DIAGNOSIS — G5760 Lesion of plantar nerve, unspecified lower limb: Secondary | ICD-10-CM

## 2017-04-06 DIAGNOSIS — G588 Other specified mononeuropathies: Secondary | ICD-10-CM

## 2017-04-06 NOTE — Progress Notes (Signed)
He presents today for follow-up of his neuroma third interdigital space bilaterally.  He states that he seems to be improving with each injection.  However recently over the past 3 weeks I have had a gout flareup more than likely due to the scallops that I been eating.  He states there is right here as he points to the first metatarsophalangeal joint and the hallux interphalangeal joint.  Objective: Vital signs are stable he is alert and oriented x3.  Pulses are palpable.  He has a palpable Mulder's click to the third interdigital space bilaterally.  Muscle strength is normal symmetrical hammertoe deformities are still present mallet toe deformity present right he has tenderness on range of motion of the first metatarsophalangeal joint right hallux valgus deformity and gouty tophi buildup.  Mild erythema no cellulitis drainage or odor no open wounds.  Assessment: Neuroma third interdigital space bilateral improving with dehydrated alcohol injections.  Resolving gouty arthritis first metatarsophalangeal joint right foot capsulitis.  Plan: After verbal consent we discussed injecting the first metatarsophalangeal joint.  This is performed after sterile Betadine skin prep.  Injected with 20 mg Kenalog 5 mg Marcaine surrounding the joint.  Also injected dorsally to the third interdigital space 2 cc of 4% dehydrated alcohol bilaterally.  He tolerated procedure well without ALT without complications.  I will follow-up with him in 1 month.  He will call sooner if needed.

## 2017-04-09 DIAGNOSIS — H6240 Otitis externa in other diseases classified elsewhere, unspecified ear: Secondary | ICD-10-CM | POA: Diagnosis not present

## 2017-04-09 DIAGNOSIS — H906 Mixed conductive and sensorineural hearing loss, bilateral: Secondary | ICD-10-CM | POA: Diagnosis not present

## 2017-04-09 DIAGNOSIS — B369 Superficial mycosis, unspecified: Secondary | ICD-10-CM | POA: Diagnosis not present

## 2017-04-22 ENCOUNTER — Ambulatory Visit (INDEPENDENT_AMBULATORY_CARE_PROVIDER_SITE_OTHER): Payer: PPO | Admitting: Internal Medicine

## 2017-04-22 ENCOUNTER — Ambulatory Visit: Payer: Self-pay | Admitting: *Deleted

## 2017-04-22 ENCOUNTER — Encounter: Payer: Self-pay | Admitting: Internal Medicine

## 2017-04-22 VITALS — BP 120/70 | HR 80 | Temp 98.0°F | Wt 207.0 lb

## 2017-04-22 DIAGNOSIS — J00 Acute nasopharyngitis [common cold]: Secondary | ICD-10-CM | POA: Diagnosis not present

## 2017-04-22 MED ORDER — AZITHROMYCIN 250 MG PO TABS: ORAL_TABLET | ORAL | 0 refills | Status: DC

## 2017-04-22 NOTE — Telephone Encounter (Signed)
Pt called with complaints of "another cold in my chest"; started a week ago and has worsened over the past 2 days"; having shortness of breath, coughing up dark green phlegm; nurse triage initiated and recommendations made per protocol; pt to see physician within 4 hours; pt requests Dr Silvio Pate but no appointments within parameters set by nurse triage; spoke with Earl Lagos at [facility] and pt offered and accepted appointment with Webb Silversmith today at 1145; pt verbalizes understanding.      Reason for Disposition . [1] MILD difficulty breathing (e.g., minimal/no SOB at rest, SOB with walking, pulse <100) AND [2] NEW-onset or WORSE than normal  Answer Assessment - Initial Assessment Questions 1. RESPIRATORY STATUS: "Describe your breathing?" (e.g., wheezing, shortness of breath, unable to speak, severe coughing)      Shortness of breath 2. ONSET: "When did this breathing problem begin?"      2 days ago 3. PATTERN "Does the difficult breathing come and go, or has it been constant since it started?"      All the time today 4. SEVERITY: "How bad is your breathing?" (e.g., mild, moderate, severe)    - MILD: No SOB at rest, mild SOB with walking, speaks normally in sentences, can lay down, no retractions, pulse < 100.    - MODERATE: SOB at rest, SOB with minimal exertion and prefers to sit, cannot lie down flat, speaks in phrases, mild retractions, audible wheezing, pulse 100-120.    - SEVERE: Very SOB at rest, speaks in single words, struggling to breathe, sitting hunched forward, retractions, pulse > 120      mild 5. RECURRENT SYMPTOM: "Have you had difficulty breathing before?" If so, ask: "When was the last time?" and "What happened that time?"      yes 6. CARDIAC HISTORY: "Do you have any history of heart disease?" (e.g., heart attack, angina, bypass surgery, angioplasty)    Yes; heart attack, angioplasty with stent 7. LUNG HISTORY: "Do you have any history of lung disease?"  (e.g., pulmonary  embolus, asthma, emphysema)     Yes, shortness of breath; albuterol inhaler as needed  8. CAUSE: "What do you think is causing the breathing problem?"      cold 9. OTHER SYMPTOMS: "Do you have any other symptoms? (e.g., dizziness, runny nose, cough, chest pain, fever)     cough 10. PREGNANCY: "Is there any chance you are pregnant?" "When was your last menstrual period?"       n/a 11. TRAVEL: "Have you traveled out of the country in the last month?" (e.g., travel history, exposures)       no  Protocols used: BREATHING DIFFICULTY-A-AH

## 2017-04-22 NOTE — Patient Instructions (Signed)
Upper Respiratory Infection, Adult Most upper respiratory infections (URIs) are caused by a virus. A URI affects the nose, throat, and upper air passages. The most common type of URI is often called "the common cold." Follow these instructions at home:  Take medicines only as told by your doctor.  Gargle warm saltwater or take cough drops to comfort your throat as told by your doctor.  Use a warm mist humidifier or inhale steam from a shower to increase air moisture. This may make it easier to breathe.  Drink enough fluid to keep your pee (urine) clear or pale yellow.  Eat soups and other clear broths.  Have a healthy diet.  Rest as needed.  Go back to work when your fever is gone or your doctor says it is okay. ? You may need to stay home longer to avoid giving your URI to others. ? You can also wear a face mask and wash your hands often to prevent spread of the virus.  Use your inhaler more if you have asthma.  Do not use any tobacco products, including cigarettes, chewing tobacco, or electronic cigarettes. If you need help quitting, ask your doctor. Contact a doctor if:  You are getting worse, not better.  Your symptoms are not helped by medicine.  You have chills.  You are getting more short of breath.  You have brown or red mucus.  You have yellow or brown discharge from your nose.  You have pain in your face, especially when you bend forward.  You have a fever.  You have puffy (swollen) neck glands.  You have pain while swallowing.  You have white areas in the back of your throat. Get help right away if:  You have very bad or constant: ? Headache. ? Ear pain. ? Pain in your forehead, behind your eyes, and over your cheekbones (sinus pain). ? Chest pain.  You have long-lasting (chronic) lung disease and any of the following: ? Wheezing. ? Long-lasting cough. ? Coughing up blood. ? A change in your usual mucus.  You have a stiff neck.  You have  changes in your: ? Vision. ? Hearing. ? Thinking. ? Mood. This information is not intended to replace advice given to you by your health care provider. Make sure you discuss any questions you have with your health care provider. Document Released: 08/19/2007 Document Revised: 11/03/2015 Document Reviewed: 06/07/2013 Elsevier Interactive Patient Education  2018 Elsevier Inc.  

## 2017-04-22 NOTE — Telephone Encounter (Signed)
Pt has appt 04/22/17 at 11:45 with Avie Echevaria NP.
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
## 2017-04-30 DIAGNOSIS — H906 Mixed conductive and sensorineural hearing loss, bilateral: Secondary | ICD-10-CM | POA: Diagnosis not present

## 2017-05-03 ENCOUNTER — Ambulatory Visit (INDEPENDENT_AMBULATORY_CARE_PROVIDER_SITE_OTHER): Payer: PPO | Admitting: Orthopaedic Surgery

## 2017-05-03 ENCOUNTER — Encounter (INDEPENDENT_AMBULATORY_CARE_PROVIDER_SITE_OTHER): Payer: Self-pay | Admitting: Orthopaedic Surgery

## 2017-05-03 DIAGNOSIS — M19011 Primary osteoarthritis, right shoulder: Secondary | ICD-10-CM | POA: Diagnosis not present

## 2017-05-03 NOTE — Progress Notes (Signed)
The patient is here for follow-up a month after Dr. Ernestina Patches provided an intra-articular steroid injection in his right glenohumeral joint.  He has been told that he needs a shoulder replacement and he was agreeing with this but he wanted to try an injection.  He says the injections helped greatly he is doing well overall.  On examination do not feel the grinding and popping that was there previous and his function is actually excellent in terms of at his age of 19 and severity of his arthritis that he can reach above his head pretty easily and behind him and across the front.  At this point follow-up as needed knowing that he can always try another right shoulder intra-articular steroid injection later in the year if needed.  All questions concerns were answered and addressed.

## 2017-05-04 ENCOUNTER — Ambulatory Visit: Payer: PPO | Admitting: Podiatry

## 2017-05-04 ENCOUNTER — Encounter: Payer: Self-pay | Admitting: Podiatry

## 2017-05-04 DIAGNOSIS — G5760 Lesion of plantar nerve, unspecified lower limb: Secondary | ICD-10-CM | POA: Diagnosis not present

## 2017-05-04 DIAGNOSIS — G588 Other specified mononeuropathies: Secondary | ICD-10-CM

## 2017-05-04 NOTE — Progress Notes (Signed)
He presents today states that he is doing about 90% better.  States that he still has some jabbing pains occasionally.  Objective: Vital signs are stable he is alert and oriented x3.  Pulses remain palpable.  He still has a palpable Mulder's click to the third interdigital space bilateral.  Hammertoe deformities are still present.  Nontender.  Assessment: Neuroma third interdigital space bilateral foot.  Plan: Discussed etiology pathology conservative versus surgical therapies.  After sterile alcohol skin prep we injected 2 cc of a 4% dehydrated alcohol to the third interdigital space bilaterally.  He tolerated the procedure well.

## 2017-05-25 ENCOUNTER — Encounter: Payer: Self-pay | Admitting: Internal Medicine

## 2017-05-27 DIAGNOSIS — B369 Superficial mycosis, unspecified: Secondary | ICD-10-CM | POA: Diagnosis not present

## 2017-05-27 DIAGNOSIS — H6243 Otitis externa in other diseases classified elsewhere, bilateral: Secondary | ICD-10-CM | POA: Diagnosis not present

## 2017-06-01 ENCOUNTER — Ambulatory Visit: Payer: PPO | Admitting: Podiatry

## 2017-06-14 DIAGNOSIS — M25532 Pain in left wrist: Secondary | ICD-10-CM | POA: Diagnosis not present

## 2017-06-14 DIAGNOSIS — M654 Radial styloid tenosynovitis [de Quervain]: Secondary | ICD-10-CM | POA: Diagnosis not present

## 2017-06-15 ENCOUNTER — Ambulatory Visit: Payer: PPO | Admitting: Podiatry

## 2017-06-15 ENCOUNTER — Encounter: Payer: Self-pay | Admitting: Podiatry

## 2017-06-15 DIAGNOSIS — G588 Other specified mononeuropathies: Secondary | ICD-10-CM

## 2017-06-15 NOTE — Progress Notes (Signed)
He presents today for follow-up of his neuromas third interdigital space bilaterally.  He states that they feel 110% better but they still have some burning occasionally and he can only be on his feet for a couple hours at a time before they start to bother him he states that he is doing quite well.  Objective: Vital signs are stable he is alert and oriented x3 still has palpable Mulder's click with tenderness on palpation to the third interdigital space bilateral.  Her graph assessment: Resolving neuromas third interdigital space bilateral.  Resolving neuritis.  Plan: Reinjected dehydrated alcohol today to cc were injected after sterile Betadine skin prep to the third interdigital space bilaterally and covered with a Band-Aid.  Tolerated procedure well without complications follow-up with him in 3 weeks

## 2017-06-23 DIAGNOSIS — H906 Mixed conductive and sensorineural hearing loss, bilateral: Secondary | ICD-10-CM | POA: Diagnosis not present

## 2017-06-23 DIAGNOSIS — B369 Superficial mycosis, unspecified: Secondary | ICD-10-CM | POA: Diagnosis not present

## 2017-06-23 DIAGNOSIS — H6240 Otitis externa in other diseases classified elsewhere, unspecified ear: Secondary | ICD-10-CM | POA: Diagnosis not present

## 2017-07-14 DIAGNOSIS — H6123 Impacted cerumen, bilateral: Secondary | ICD-10-CM | POA: Diagnosis not present

## 2017-07-15 ENCOUNTER — Ambulatory Visit: Payer: PPO | Admitting: Podiatry

## 2017-07-15 DIAGNOSIS — G588 Other specified mononeuropathies: Secondary | ICD-10-CM

## 2017-07-15 NOTE — Progress Notes (Signed)
He presents today for follow-up of his neuroma third interdigital space bilaterally.  He states that he had to travel to [place] recently and had to walk from one and of [place] airport to the other 3 times and states that his feet were not even hurting him at all.  Objective: Vital signs are stable he is alert and oriented x3.  Still has pain on palpation to the third interdigital space but has resolved considerably.  Assessment: Resolving neuroma third interdigital space bilateral.  Plan: After sterile Betadine skin prep I injected 4% dehydrated alcohol a total of 2 cc was injected to each interdigital space.  I will follow-up with him in 3 weeks.

## 2017-07-20 ENCOUNTER — Other Ambulatory Visit (INDEPENDENT_AMBULATORY_CARE_PROVIDER_SITE_OTHER): Payer: PPO

## 2017-07-20 ENCOUNTER — Encounter: Payer: Self-pay | Admitting: Internal Medicine

## 2017-07-20 ENCOUNTER — Ambulatory Visit: Payer: PPO | Admitting: Internal Medicine

## 2017-07-20 VITALS — BP 108/70 | HR 84 | Ht 69.0 in | Wt 202.4 lb

## 2017-07-20 DIAGNOSIS — R630 Anorexia: Secondary | ICD-10-CM

## 2017-07-20 DIAGNOSIS — Z86010 Personal history of colonic polyps: Secondary | ICD-10-CM | POA: Diagnosis not present

## 2017-07-20 DIAGNOSIS — R634 Abnormal weight loss: Secondary | ICD-10-CM | POA: Diagnosis not present

## 2017-07-20 DIAGNOSIS — R6881 Early satiety: Secondary | ICD-10-CM

## 2017-07-20 LAB — CBC WITH DIFFERENTIAL/PLATELET
BASOS ABS: 0.1 10*3/uL (ref 0.0–0.1)
Basophils Relative: 0.9 % (ref 0.0–3.0)
Eosinophils Absolute: 0.1 10*3/uL (ref 0.0–0.7)
Eosinophils Relative: 1.5 % (ref 0.0–5.0)
HCT: 46.1 % (ref 39.0–52.0)
Hemoglobin: 15.8 g/dL (ref 13.0–17.0)
LYMPHS ABS: 1.8 10*3/uL (ref 0.7–4.0)
Lymphocytes Relative: 18.6 % (ref 12.0–46.0)
MCHC: 34.2 g/dL (ref 30.0–36.0)
MCV: 89 fl (ref 78.0–100.0)
MONO ABS: 1 10*3/uL (ref 0.1–1.0)
Monocytes Relative: 10 % (ref 3.0–12.0)
NEUTROS ABS: 6.9 10*3/uL (ref 1.4–7.7)
NEUTROS PCT: 69 % (ref 43.0–77.0)
PLATELETS: 277 10*3/uL (ref 150.0–400.0)
RBC: 5.18 Mil/uL (ref 4.22–5.81)
RDW: 14.6 % (ref 11.5–15.5)
WBC: 9.9 10*3/uL (ref 4.0–10.5)

## 2017-07-20 LAB — COMPREHENSIVE METABOLIC PANEL
ALT: 15 U/L (ref 0–53)
AST: 23 U/L (ref 0–37)
Albumin: 3.9 g/dL (ref 3.5–5.2)
Alkaline Phosphatase: 78 U/L (ref 39–117)
BUN: 19 mg/dL (ref 6–23)
CHLORIDE: 102 meq/L (ref 96–112)
CO2: 27 mEq/L (ref 19–32)
CREATININE: 1.35 mg/dL (ref 0.40–1.50)
Calcium: 9.6 mg/dL (ref 8.4–10.5)
GFR: 54.29 mL/min — ABNORMAL LOW (ref >60.00)
GLUCOSE: 98 mg/dL (ref 70–99)
POTASSIUM: 4.7 meq/L (ref 3.5–5.1)
SODIUM: 137 meq/L (ref 135–145)
Total Bilirubin: 0.7 mg/dL (ref 0.2–1.2)
Total Protein: 7.4 g/dL (ref 6.0–8.3)

## 2017-07-20 LAB — TSH: TSH: 2.01 u[IU]/mL (ref 0.35–4.50)
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
## 2017-07-20 NOTE — Patient Instructions (Signed)
  You have been scheduled for an endoscopy. Please follow written instructions given to you at your visit today. If you use inhalers (even only as needed), please bring them with you on the day of your procedure.  Your provider has requested that you go to the basement level for lab work before leaving today. Press "B" on the elevator. The lab is located at the first door on the left as you exit the elevator.   I appreciate the opportunity to care for you. Silvano Rusk, MD, [facility]

## 2017-07-21 NOTE — Progress Notes (Signed)
Labs are ok  Please let him know

## 2017-07-22 ENCOUNTER — Telehealth: Payer: Self-pay | Admitting: Internal Medicine

## 2017-07-22 NOTE — Telephone Encounter (Signed)
Returned his call and left a voicemail message.

## 2017-07-23 ENCOUNTER — Telehealth: Payer: Self-pay

## 2017-07-23 NOTE — Telephone Encounter (Signed)
Spoke with patient and gave him the lab results.

## 2017-07-27 ENCOUNTER — Encounter: Payer: Self-pay | Admitting: Internal Medicine

## 2017-07-27 ENCOUNTER — Ambulatory Visit (AMBULATORY_SURGERY_CENTER): Payer: PPO | Admitting: Internal Medicine

## 2017-07-27 ENCOUNTER — Other Ambulatory Visit: Payer: Self-pay

## 2017-07-27 VITALS — BP 126/72 | HR 76 | Temp 98.9°F | Resp 16 | Ht 69.0 in | Wt 202.0 lb

## 2017-07-27 DIAGNOSIS — K317 Polyp of stomach and duodenum: Secondary | ICD-10-CM | POA: Diagnosis not present

## 2017-07-27 DIAGNOSIS — K2990 Gastroduodenitis, unspecified, without bleeding: Secondary | ICD-10-CM | POA: Diagnosis not present

## 2017-07-27 DIAGNOSIS — K2970 Gastritis, unspecified, without bleeding: Secondary | ICD-10-CM

## 2017-07-27 DIAGNOSIS — K219 Gastro-esophageal reflux disease without esophagitis: Secondary | ICD-10-CM | POA: Diagnosis not present

## 2017-07-27 DIAGNOSIS — R42 Dizziness and giddiness: Secondary | ICD-10-CM | POA: Diagnosis not present

## 2017-07-27 DIAGNOSIS — R634 Abnormal weight loss: Secondary | ICD-10-CM | POA: Diagnosis not present

## 2017-07-27 DIAGNOSIS — I10 Essential (primary) hypertension: Secondary | ICD-10-CM | POA: Diagnosis not present

## 2017-07-27 DIAGNOSIS — I2510 Atherosclerotic heart disease of native coronary artery without angina pectoris: Secondary | ICD-10-CM | POA: Diagnosis not present

## 2017-07-27 DIAGNOSIS — R6881 Early satiety: Secondary | ICD-10-CM | POA: Diagnosis not present

## 2017-07-27 MED ORDER — SODIUM CHLORIDE 0.9 % IV SOLN: 500.0000 mL | Freq: Once | INTRAVENOUS | Status: DC

## 2017-07-27 NOTE — Progress Notes (Signed)
Report to PACU, RN, vss, BBS= Clear.  

## 2017-07-27 NOTE — Op Note (Signed)
[place] Patient Name: Jonathan Glass Procedure Date: 07/27/2017 10:47 AM MRN: [id number] Endoscopist: Gatha Mayer , MD Age: 77 Referring MD:  Date of Birth: 28-Mar-1940 Gender: Male Account #: [id number] Procedure:                Upper GI endoscopy Indications:              Dyspepsia, Anorexia, Early satiety, Weight loss Medicines:                Propofol per Anesthesia, Monitored Anesthesia Care Procedure:                Pre-Anesthesia Assessment:                           - Prior to the procedure, a History and Physical                            was performed, and patient medications and                            allergies were reviewed. The patient's tolerance of                            previous anesthesia was also reviewed. The risks                            and benefits of the procedure and the sedation                            options and risks were discussed with the patient.                            All questions were answered, and informed consent                            was obtained. Prior Anticoagulants: The patient has                            taken no previous anticoagulant or antiplatelet                            agents. ASA Grade Assessment: III - A patient with                            severe systemic disease. After reviewing the risks                            and benefits, the patient was deemed in                            satisfactory condition to undergo the procedure.                           After obtaining informed consent, the endoscope was  passed under direct vision. Throughout the                            procedure, the patient's blood pressure, pulse, and                            oxygen saturations were monitored continuously. The                            Endoscope was introduced through the mouth, and                            advanced to the second part of duodenum. The upper                       GI endoscopy was accomplished without difficulty.                            The patient tolerated the procedure well. Scope In: Scope Out: Findings:                 Diffuse moderate inflammation characterized by                            erythema, friability, granularity and mucus was                            found in the gastric body and in the gastric                            antrum. Biopsies were taken with a cold forceps for                            histology. Estimated blood loss was minimal.                           Diffuse moderate inflammation characterized by                            erythema, friability, granularity and mucus was                            found in the duodenal bulb.                           The exam was otherwise without abnormality.                           The cardia and gastric fundus were normal on                            retroflexion. Complications:            No immediate complications. Estimated Blood Loss:     Estimated blood loss was minimal. Impression:               -  Gastritis. Biopsied.                           - Duodenitis.                           - The examination was otherwise normal. Recommendation:           - Patient has a contact number available for                            emergencies. The signs and symptoms of potential                            delayed complications were discussed with the                            patient. Return to normal activities tomorrow.                            Written discharge instructions were provided to the                            patient.                           - Resume previous diet.                           - Continue present medications.                           - Await pathology results.                           - treat H pylori if +                           he is currently off omeprazole                           ? CT scan if bxs unhelpful Gatha Mayer, MD 07/27/2017 11:08:25 AM This report has been signed electronically.

## 2017-07-27 NOTE — Progress Notes (Signed)
Called to room to assist during endoscopic procedure.  Patient ID and intended procedure confirmed with present staff. Received instructions for my participation in the procedure from the performing physician.  

## 2017-07-27 NOTE — Patient Instructions (Addendum)
The stomach lining and the intestine lining look irritated and inflamed. I took biopsies to see if there is an infection or other cause. I did not see anything that looks like cancer.  Once biopsy results are back we will call you.  I appreciate the opportunity to care for you. Gatha Mayer, MD, FACG  YOU HAD AN ENDOSCOPIC PROCEDURE TODAY AT [facility]:   Refer to the procedure report that was given to you for any specific questions about what was found during the examination.  If the procedure report does not answer your questions, please call your gastroenterologist to clarify.  If you requested that your care partner not be given the details of your procedure findings, then the procedure report has been included in a sealed envelope for you to review at your convenience later.  YOU SHOULD EXPECT: Some feelings of bloating in the abdomen. Passage of more gas than usual.  Walking can help get rid of the air that was put into your GI tract during the procedure and reduce the bloating. If you had a lower endoscopy (such as a colonoscopy or flexible sigmoidoscopy) you may notice spotting of blood in your stool or on the toilet paper. If you underwent a bowel prep for your procedure, you may not have a normal bowel movement for a few days.  Please Note:  You might notice some irritation and congestion in your nose or some drainage.  This is from the oxygen used during your procedure.  There is no need for concern and it should clear up in a day or so.  SYMPTOMS TO REPORT IMMEDIATELY:   Following lower endoscopy (colonoscopy or flexible sigmoidoscopy):  Excessive amounts of blood in the stool  Significant tenderness or worsening of abdominal pains  Swelling of the abdomen that is new, acute  Fever of 100F or higher   Following upper endoscopy (EGD)  Vomiting of blood or coffee ground material  New chest pain or pain under the shoulder blades  Painful or  persistently difficult swallowing  New shortness of breath  Fever of 100F or higher  Black, tarry-looking stools  For urgent or emergent issues, a gastroenterologist can be reached at any hour by calling [phone].   DIET:  We do recommend a small meal at first, but then you may proceed to your regular diet.  Drink plenty of fluids but you should avoid alcoholic beverages for 24 hours.  ACTIVITY:  You should plan to take it easy for the rest of today and you should NOT DRIVE or use heavy machinery until tomorrow (because of the sedation medicines used during the test).    FOLLOW UP: Our staff will call the number listed on your records the next business day following your procedure to check on you and address any questions or concerns that you may have regarding the information given to you following your procedure. If we do not reach you, we will leave a message.  However, if you are feeling well and you are not experiencing any problems, there is no need to return our call.  We will assume that you have returned to your regular daily activities without incident.  If any biopsies were taken you will be contacted by phone or by letter within the next 1-3 weeks.  Please call us at [phone] if you have not heard about the biopsies in 3 weeks.    SIGNATURES/CONFIDENTIALITY: You and/or your care partner have signed paperwork which  will be entered into your electronic medical record.  These signatures attest to the fact that that the information above on your After Visit Summary has been reviewed and is understood.  Full responsibility of the confidentiality of this discharge information lies with you and/or your care-partner.YOU HAD AN ENDOSCOPIC PROCEDURE TODAY AT [facility]:   Refer to the procedure report that was given to you for any specific questions about what was found during the examination.  If the procedure report does not answer your questions, please call  your gastroenterologist to clarify.  If you requested that your care partner not be given the details of your procedure findings, then the procedure report has been included in a sealed envelope for you to review at your convenience later.  YOU SHOULD EXPECT: Some feelings of bloating in the abdomen. Passage of more gas than usual.  Walking can help get rid of the air that was put into your GI tract during the procedure and reduce the bloating. If you had a lower endoscopy (such as a colonoscopy or flexible sigmoidoscopy) you may notice spotting of blood in your stool or on the toilet paper. If you underwent a bowel prep for your procedure, you may not have a normal bowel movement for a few days.  Please Note:  You might notice some irritation and congestion in your nose or some drainage.  This is from the oxygen used during your procedure.  There is no need for concern and it should clear up in a day or so.  SYMPTOMS TO REPORT IMMEDIATELY:   Following lower endoscopy (colonoscopy or flexible sigmoidoscopy):  Excessive amounts of blood in the stool  Significant tenderness or worsening of abdominal pains  Swelling of the abdomen that is new, acute  Fever of 100F or higher   Following upper endoscopy (EGD)  Vomiting of blood or coffee ground material  New chest pain or pain under the shoulder blades  Painful or persistently difficult swallowing  New shortness of breath  Fever of 100F or higher  Black, tarry-looking stools  For urgent or emergent issues, a gastroenterologist can be reached at any hour by calling [phone].   DIET:  We do recommend a small meal at first, but then you may proceed to your regular diet.  Drink plenty of fluids but you should avoid alcoholic beverages for 24 hours.  ACTIVITY:  You should plan to take it easy for the rest of today and you should NOT DRIVE or use heavy machinery until tomorrow (because of the sedation medicines used during the test).     FOLLOW UP: Our staff will call the number listed on your records the next business day following your procedure to check on you and address any questions or concerns that you may have regarding the information given to you following your procedure. If we do not reach you, we will leave a message.  However, if you are feeling well and you are not experiencing any problems, there is no need to return our call.  We will assume that you have returned to your regular daily activities without incident.  If any biopsies were taken you will be contacted by phone or by letter within the next 1-3 weeks.  Please call us at [phone] if you have not heard about the biopsies in 3 weeks.    SIGNATURES/CONFIDENTIALITY: You and/or your care partner have signed paperwork which will be entered into your electronic medical record.  These signatures attest  to the fact that that the information above on your After Visit Summary has been reviewed and is understood.  Full responsibility of the confidentiality of this discharge information lies with you and/or your care-partner.  Gastritis information given.

## 2017-07-27 NOTE — Progress Notes (Signed)
Pt's states no medical or surgical changes since previsit or office visit. 

## 2017-07-28 ENCOUNTER — Telehealth: Payer: Self-pay

## 2017-07-28 NOTE — Telephone Encounter (Signed)
  Follow up Call-  Call back number 07/27/2017  Post procedure Call Back phone  # [phone]  Permission to leave phone message Yes  Some recent data might be hidden     Patient questions:  Do you have a fever, pain , or abdominal swelling? No. Pain Score  0 *  Have you tolerated food without any problems? Yes.    Have you been able to return to your normal activities? Yes.    Do you have any questions about your discharge instructions: Diet   No. Medications  No. Follow up visit  No.  Do you have questions or concerns about your Care? No.  Actions: * If pain score is 4 or above: No action needed, pain <4.

## 2017-07-28 NOTE — Telephone Encounter (Signed)
  Follow up Call-  Call back number 07/27/2017  Post procedure Call Back phone  # [phone]  Permission to leave phone message Yes  Some recent data might be hidden     No ID on answering machine.  No message left.  Will try again this afternoon. Angela/ Call-back [place]

## 2017-08-02 ENCOUNTER — Other Ambulatory Visit: Payer: Self-pay | Admitting: Internal Medicine

## 2017-08-02 MED ORDER — DIAZEPAM 2 MG PO TABS: 2.0000 mg | ORAL_TABLET | Freq: Three times a day (TID) | ORAL | 0 refills | Status: AC | PRN

## 2017-08-02 NOTE — Telephone Encounter (Signed)
Last filled 01-27-17 #90 Last OV Acute 04-22-17 Next OV 08-11-17

## 2017-08-03 ENCOUNTER — Encounter: Payer: Self-pay | Admitting: Podiatry

## 2017-08-03 ENCOUNTER — Ambulatory Visit: Payer: PPO | Admitting: Podiatry

## 2017-08-03 DIAGNOSIS — G588 Other specified mononeuropathies: Secondary | ICD-10-CM

## 2017-08-03 NOTE — Progress Notes (Signed)
He presents today for follow-up of his neuromas and neuritis third interdigital space bilaterally.  He states that he is doing much better and ever improving.  States that he feels like he has had a little bit of a setback recently but seems to be doing better.  Objective: Vital signs are stable he is alert and oriented x3.  Pulses are palpable.  Pain on palpation third interdigital space bilateral with palpable Mulder's click.  Right seems to be worse than the left.  Assessment: Pain in limb secondary to neuroma third interdigital space bilateral.  Plan: After sterile Betadine skin prep I injected 2 cc of 4% dehydrated alcohol and local anesthetic to the third interdigital space bilaterally.  He tolerated procedure well without complications.

## 2017-08-04 ENCOUNTER — Encounter: Payer: Self-pay | Admitting: Internal Medicine

## 2017-08-04 DIAGNOSIS — K317 Polyp of stomach and duodenum: Secondary | ICD-10-CM | POA: Insufficient documentation

## 2017-08-04 HISTORY — DX: Polyp of stomach and duodenum: K31.7

## 2017-08-04 NOTE — Progress Notes (Signed)
Gastric bxs negative, and fundic gland polyp noted No recall  Call from office - recommend CT abd/pelvis w/ contrast Dx early satiety, weight loss

## 2017-08-05 ENCOUNTER — Ambulatory Visit: Payer: PPO | Admitting: Podiatry

## 2017-08-05 DIAGNOSIS — H6241 Otitis externa in other diseases classified elsewhere, right ear: Secondary | ICD-10-CM | POA: Diagnosis not present

## 2017-08-05 DIAGNOSIS — B369 Superficial mycosis, unspecified: Secondary | ICD-10-CM | POA: Diagnosis not present

## 2017-08-11 ENCOUNTER — Encounter: Payer: Self-pay | Admitting: Internal Medicine

## 2017-08-11 ENCOUNTER — Ambulatory Visit (INDEPENDENT_AMBULATORY_CARE_PROVIDER_SITE_OTHER): Payer: PPO | Admitting: Internal Medicine

## 2017-08-11 VITALS — BP 132/70 | HR 67 | Temp 97.4°F | Ht 69.0 in | Wt 203.0 lb

## 2017-08-11 DIAGNOSIS — M069 Rheumatoid arthritis, unspecified: Secondary | ICD-10-CM | POA: Diagnosis not present

## 2017-08-11 DIAGNOSIS — F324 Major depressive disorder, single episode, in partial remission: Secondary | ICD-10-CM | POA: Diagnosis not present

## 2017-08-11 DIAGNOSIS — I10 Essential (primary) hypertension: Secondary | ICD-10-CM

## 2017-08-11 DIAGNOSIS — Z7189 Other specified counseling: Secondary | ICD-10-CM | POA: Diagnosis not present

## 2017-08-11 DIAGNOSIS — Z0000 Encounter for general adult medical examination without abnormal findings: Secondary | ICD-10-CM | POA: Diagnosis not present

## 2017-08-11 DIAGNOSIS — I25119 Atherosclerotic heart disease of native coronary artery with unspecified angina pectoris: Secondary | ICD-10-CM | POA: Diagnosis not present

## 2017-08-11 DIAGNOSIS — I5032 Chronic diastolic (congestive) heart failure: Secondary | ICD-10-CM | POA: Diagnosis not present

## 2017-08-11 DIAGNOSIS — J449 Chronic obstructive pulmonary disease, unspecified: Secondary | ICD-10-CM | POA: Diagnosis not present

## 2017-08-11 LAB — LIPID PANEL
CHOLESTEROL: 175 mg/dL (ref 0–200)
HDL: 45.8 mg/dL (ref >39.00)
LDL Cholesterol: 100 mg/dL — ABNORMAL HIGH (ref 0–99)
NonHDL: 128.77
TRIGLYCERIDES: 143 mg/dL (ref 0.0–149.0)
Total CHOL/HDL Ratio: 4
VLDL: 28.6 mg/dL (ref 0.0–40.0)

## 2017-08-11 LAB — SEDIMENTATION RATE: Sed Rate: 21 mm/hr — ABNORMAL HIGH (ref 0–20)

## 2017-08-11 MED ORDER — OMEPRAZOLE 20 MG PO CPDR: 20.0000 mg | DELAYED_RELEASE_CAPSULE | Freq: Every day | ORAL | 3 refills | Status: AC

## 2017-08-11 NOTE — Assessment & Plan Note (Signed)
PE supports diagnosis Will recheck serology---start MTX if RF positive or if elevated sed rate

## 2017-08-11 NOTE — Assessment & Plan Note (Signed)
Only needs inhaler occasionally Fortunately--stopped smoking years ago

## 2017-08-11 NOTE — Assessment & Plan Note (Signed)
BP Readings from Last 3 Encounters:  08/11/17 132/70  07/27/17 126/72  07/20/17 108/70   Good control

## 2017-08-11 NOTE — Assessment & Plan Note (Signed)
Compensated now Furosemide daily now

## 2017-08-11 NOTE — Assessment & Plan Note (Signed)
Still with milder mood issues Better with daily care of great grandson Continue the sertraline

## 2017-08-11 NOTE — Progress Notes (Signed)
Hearing Screening Comments: Wears hearing aids. Wearing them today. Vision Screening Comments: July 2018

## 2017-08-11 NOTE — Assessment & Plan Note (Signed)
I have personally reviewed the Medicare Annual Wellness questionnaire and have noted 1. The patient's medical and social history 2. Their use of alcohol, tobacco or illicit drugs 3. Their current medications and supplements 4. The patient's functional ability including ADL's, fall risks, home safety risks and hearing or visual             impairment. 5. Diet and physical activities 6. Evidence for depression or mood disorders  The patients weight, height, BMI and visual acuity have been recorded in the chart I have made referrals, counseling and provided education to the patient based review of the above and I have provided the pt with a written personalized care plan for preventive services.  I have provided you with a copy of your personalized plan for preventive services. Please take the time to review along with your updated medication list.  No cancer screening due to age Yearly flu vaccine Will consider shingrix Gi evaluation due to weight loss---getting CT scan set up

## 2017-08-11 NOTE — Assessment & Plan Note (Signed)
Has DNR 
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
## 2017-08-11 NOTE — Assessment & Plan Note (Signed)
Has stable DOE Due for cardiology follow up soon

## 2017-08-12 ENCOUNTER — Other Ambulatory Visit: Payer: Self-pay | Admitting: Internal Medicine

## 2017-08-12 LAB — RHEUMATOID FACTOR: Rhuematoid fact SerPl-aCnc: 193 IU/mL — ABNORMAL HIGH (ref <14)

## 2017-08-12 MED ORDER — METHOTREXATE 2.5 MG PO TABS: 15.0000 mg | ORAL_TABLET | ORAL | 11 refills | Status: DC

## 2017-08-13 ENCOUNTER — Telehealth: Payer: Self-pay | Admitting: Internal Medicine

## 2017-08-13 ENCOUNTER — Other Ambulatory Visit: Payer: Self-pay

## 2017-08-13 MED ORDER — FOLIC ACID 1 MG PO TABS: 1.0000 mg | ORAL_TABLET | Freq: Every day | ORAL | 3 refills | Status: DC

## 2017-08-13 NOTE — Telephone Encounter (Signed)
Spoke to pt

## 2017-08-13 NOTE — Telephone Encounter (Signed)
Spoke to [place]. Gave Dx Code

## 2017-08-13 NOTE — Telephone Encounter (Signed)
Since he is back on methotrexate, he needs to take Folic Acid 1mg  daily. Rx has been sent to the pharmacy.

## 2017-08-13 NOTE — Telephone Encounter (Signed)
Rheumatoid arthritis involving multiple sites, unspecified rheumatoid factor presence ([place])  M06.9

## 2017-08-13 NOTE — Telephone Encounter (Signed)
Copied from [place] [id number]. Topic: General - Other >> Aug 13, 2017  9:47 AM Lennox Solders wrote: Reason for CRM: julia from [employer] insurance is calling need dx code for methotrexate

## 2017-08-18 ENCOUNTER — Telehealth: Payer: Self-pay

## 2017-08-18 NOTE — Telephone Encounter (Signed)
[employer] has received your information, and the request will be reviewed. You may close this dialog, return to your dashboard, and perform other tasks.  You will receive an electronic determination in CoverMyMeds. You can see the latest determination by locating this request on your dashboard or by reopening this request. You will also receive a faxed copy of the determination. If you have any questions please contact [employer] at [phone].  If you need assistance, please chat with CoverMyMeds or call us at [phone]. 

## 2017-08-19 NOTE — Telephone Encounter (Signed)
Approved from 08-18-17 to 08-18-18

## 2017-08-27 DIAGNOSIS — H6122 Impacted cerumen, left ear: Secondary | ICD-10-CM | POA: Diagnosis not present

## 2017-08-31 ENCOUNTER — Ambulatory Visit: Payer: PPO | Admitting: Podiatry

## 2017-09-10 ENCOUNTER — Encounter: Payer: Self-pay | Admitting: Internal Medicine

## 2017-09-10 ENCOUNTER — Ambulatory Visit (INDEPENDENT_AMBULATORY_CARE_PROVIDER_SITE_OTHER): Payer: PPO | Admitting: Internal Medicine

## 2017-09-10 ENCOUNTER — Ambulatory Visit (INDEPENDENT_AMBULATORY_CARE_PROVIDER_SITE_OTHER)
Admission: RE | Admit: 2017-09-10 | Discharge: 2017-09-10 | Disposition: A | Discharge status: Home / Self Care | Payer: PPO | Source: Ambulatory Visit | Attending: Internal Medicine | Admitting: Internal Medicine

## 2017-09-10 VITALS — BP 112/66 | HR 79 | Temp 97.6°F | Ht 69.0 in | Wt 206.0 lb

## 2017-09-10 DIAGNOSIS — M069 Rheumatoid arthritis, unspecified: Secondary | ICD-10-CM

## 2017-09-10 DIAGNOSIS — M19041 Primary osteoarthritis, right hand: Secondary | ICD-10-CM | POA: Diagnosis not present

## 2017-09-10 DIAGNOSIS — M109 Gout, unspecified: Secondary | ICD-10-CM | POA: Diagnosis not present

## 2017-09-10 DIAGNOSIS — M19042 Primary osteoarthritis, left hand: Secondary | ICD-10-CM | POA: Diagnosis not present

## 2017-09-10 LAB — COMPREHENSIVE METABOLIC PANEL
ALT: 17 U/L (ref 0–53)
AST: 19 U/L (ref 0–37)
Albumin: 3.9 g/dL (ref 3.5–5.2)
Alkaline Phosphatase: 71 U/L (ref 39–117)
BUN: 20 mg/dL (ref 6–23)
CALCIUM: 9.4 mg/dL (ref 8.4–10.5)
CHLORIDE: 103 meq/L (ref 96–112)
CO2: 28 meq/L (ref 19–32)
CREATININE: 1.33 mg/dL (ref 0.40–1.50)
GFR: 55.22 mL/min — ABNORMAL LOW (ref >60.00)
Glucose, Bld: 155 mg/dL — ABNORMAL HIGH (ref 70–99)
Potassium: 4.3 mEq/L (ref 3.5–5.1)
SODIUM: 139 meq/L (ref 135–145)
Total Bilirubin: 0.6 mg/dL (ref 0.2–1.2)
Total Protein: 6.7 g/dL (ref 6.0–8.3)

## 2017-09-10 LAB — CBC
HEMATOCRIT: 44.6 % (ref 39.0–52.0)
HEMOGLOBIN: 15.3 g/dL (ref 13.0–17.0)
MCHC: 34.2 g/dL (ref 30.0–36.0)
MCV: 87.9 fl (ref 78.0–100.0)
Platelets: 190 10*3/uL (ref 150.0–400.0)
RBC: 5.07 Mil/uL (ref 4.22–5.81)
RDW: 14.8 % (ref 11.5–15.5)
WBC: 7.2 10*3/uL (ref 4.0–10.5)

## 2017-09-10 LAB — SEDIMENTATION RATE: Sed Rate: 43 mm/hr — ABNORMAL HIGH (ref 0–20)

## 2017-09-10 LAB — URIC ACID: Uric Acid, Serum: 5.5 mg/dL (ref 4.0–7.8)

## 2017-09-10 NOTE — Assessment & Plan Note (Signed)
Unclear if this is causing his symptoms at all Will recheck uric acid Continues on the colchicine daily
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
## 2017-09-10 NOTE — Assessment & Plan Note (Signed)
Clearly better since starting the MTX I think the foot pain may be RA also----though some concern about the gout in both spots Will recheck labs----consider increase in MTX in 3 months if not even better

## 2017-09-24 ENCOUNTER — Encounter: Payer: Self-pay | Admitting: Cardiovascular Disease

## 2017-09-27 DIAGNOSIS — B369 Superficial mycosis, unspecified: Secondary | ICD-10-CM | POA: Diagnosis not present

## 2017-09-27 DIAGNOSIS — H6243 Otitis externa in other diseases classified elsewhere, bilateral: Secondary | ICD-10-CM | POA: Diagnosis not present

## 2017-10-05 ENCOUNTER — Ambulatory Visit: Payer: PPO | Admitting: Podiatry

## 2017-10-05 ENCOUNTER — Encounter: Payer: Self-pay | Admitting: Podiatry

## 2017-10-05 DIAGNOSIS — G5761 Lesion of plantar nerve, right lower limb: Secondary | ICD-10-CM

## 2017-10-05 DIAGNOSIS — G5782 Other specified mononeuropathies of left lower limb: Secondary | ICD-10-CM

## 2017-10-05 DIAGNOSIS — G5762 Lesion of plantar nerve, left lower limb: Secondary | ICD-10-CM

## 2017-10-05 DIAGNOSIS — G5781 Other specified mononeuropathies of right lower limb: Secondary | ICD-10-CM

## 2017-10-05 NOTE — Progress Notes (Signed)
He presents today for follow-up of his neuroma third interdigital space bilaterally.  He states that the neuroma is doing much better but the forefoot still feels the same as the numbness and tingling and burning across the forefoot.  Objective: Vital signs are stable alert oriented x3 much decrease in pain on palpation to the third interdigital space though he still has tenderness and pain on the left palpation of the lesser metatarsophalangeal joints.  He states that the pain radiates out into the toes and begins at the base of the toes.  Assessment: Neuritis and neuropathy with neuroma third interdigital space and lesser digits.  Plan: At this point I injected this cyst first and second interdigital spaces with dehydrated alcohol and local anesthetic after sterile Betadine skin prep is to see if we can make any difference in the pain that he feels as far as the burning goes if this starts to make a difference then we will initiate injection therapy between each interdigital space See him.

## 2017-10-18 DIAGNOSIS — H60393 Other infective otitis externa, bilateral: Secondary | ICD-10-CM | POA: Diagnosis not present

## 2017-10-18 DIAGNOSIS — H906 Mixed conductive and sensorineural hearing loss, bilateral: Secondary | ICD-10-CM | POA: Diagnosis not present

## 2017-10-19 DIAGNOSIS — M654 Radial styloid tenosynovitis [de Quervain]: Secondary | ICD-10-CM | POA: Diagnosis not present

## 2017-10-26 ENCOUNTER — Ambulatory Visit: Payer: PPO | Admitting: Podiatry

## 2017-10-26 ENCOUNTER — Encounter: Payer: Self-pay | Admitting: Podiatry

## 2017-10-26 DIAGNOSIS — G5761 Lesion of plantar nerve, right lower limb: Secondary | ICD-10-CM

## 2017-10-26 DIAGNOSIS — G5762 Lesion of plantar nerve, left lower limb: Secondary | ICD-10-CM

## 2017-10-26 NOTE — Progress Notes (Signed)
He presents today for follow-up of his neuromas third interdigital space bilaterally.  States that they are feeling much better though he starting to develop more pain beneath the second and third metatarsal phalangeal joints and states that is quite aggravating for him.  Objective: Vital signs are stable he is alert and oriented x3 much decrease in pain on palpation to the third interdigital spaces bilaterally.  He does have tenderness on range of motion of the second and third metatarsal phalangeal joints with rigid deformities to those toes.  More than likely resulting in the metatarsalgia or capsulitis of those toes however cannot rule out neuritis in that area as well.  Assessment: Resolving neuroma third interdigital space bilateral.  Metatarsalgia capsulitis hammertoe deformities neuritis second interdigital space.  Plan: After sterile Betadine skin prep I injected 2 cc of 4% dehydrated alcohol to the second interdigital space bilaterally.  Tolerated procedure well without complications I will follow-up with him in the near future.

## 2017-11-08 DIAGNOSIS — H60393 Other infective otitis externa, bilateral: Secondary | ICD-10-CM | POA: Diagnosis not present

## 2017-11-18 ENCOUNTER — Ambulatory Visit (INDEPENDENT_AMBULATORY_CARE_PROVIDER_SITE_OTHER): Payer: PPO | Admitting: Podiatry

## 2017-11-18 ENCOUNTER — Encounter: Payer: Self-pay | Admitting: Podiatry

## 2017-11-18 DIAGNOSIS — G5762 Lesion of plantar nerve, left lower limb: Secondary | ICD-10-CM | POA: Diagnosis not present

## 2017-11-18 DIAGNOSIS — G5761 Lesion of plantar nerve, right lower limb: Secondary | ICD-10-CM | POA: Diagnosis not present

## 2017-11-20 NOTE — Progress Notes (Signed)
He presents today for follow-up of the neuromas third interdigital space bilaterally he says that we seem to be making progress particularly with the injections of the second intermetatarsal spaces bilateral.  Objective: Vital signs stable alert and oriented x3.  Pulses are palpable.  He has much decrease in pain on palpation to the third interdigital space second interdigital space is a little more tender.  Assessment: Neuritis neuroma neuropathy second and third interdigital space bilateral.  Plan: After sterile Betadine skin prep injected 2 cc of 4% dehydrated alcohol to the second interspace bilateral.  Tolerated procedure well without complications.  Follow-up with him in 3 to 4 weeks.

## 2017-11-29 DIAGNOSIS — H6123 Impacted cerumen, bilateral: Secondary | ICD-10-CM | POA: Diagnosis not present

## 2017-12-06 DIAGNOSIS — M654 Radial styloid tenosynovitis [de Quervain]: Secondary | ICD-10-CM | POA: Diagnosis not present

## 2017-12-07 ENCOUNTER — Encounter: Payer: Self-pay | Admitting: Podiatry

## 2017-12-07 ENCOUNTER — Ambulatory Visit: Payer: PPO | Admitting: Podiatry

## 2017-12-07 DIAGNOSIS — G5762 Lesion of plantar nerve, left lower limb: Secondary | ICD-10-CM

## 2017-12-07 DIAGNOSIS — G5761 Lesion of plantar nerve, right lower limb: Secondary | ICD-10-CM

## 2017-12-08 NOTE — Progress Notes (Signed)
He presents today for follow-up of his neuroma second interdigital space bilaterally.  He states that he has had problems still with his feet burning and hurting on the bottom however the neuroma pain has subsided considerably.  Objective: Vital signs are stable he is alert and oriented x3 he still has pain on palpation to the second interdigital space no open lesions or wounds are noted.  Assessment: Neuroma.  Plan: Reinjected another dose of dehydrated alcohol second interdigital space today bilateral after sterile Betadine skin prep a total of 2 cc of a 4% dehydrated alcohol with local anesthetic was administered to each second interdigital space.  Follow-up with him in 3 to 4 weeks

## 2017-12-17 ENCOUNTER — Ambulatory Visit: Payer: PPO | Admitting: Cardiovascular Disease

## 2017-12-17 ENCOUNTER — Encounter: Payer: Self-pay | Admitting: Cardiovascular Disease

## 2017-12-17 ENCOUNTER — Ambulatory Visit (INDEPENDENT_AMBULATORY_CARE_PROVIDER_SITE_OTHER): Payer: PPO | Admitting: Internal Medicine

## 2017-12-17 ENCOUNTER — Encounter: Payer: Self-pay | Admitting: Internal Medicine

## 2017-12-17 VITALS — BP 118/70 | HR 82 | Temp 97.6°F | Ht 69.0 in | Wt 205.0 lb

## 2017-12-17 VITALS — BP 122/60 | HR 75 | Ht 71.0 in | Wt 204.6 lb

## 2017-12-17 DIAGNOSIS — M0579 Rheumatoid arthritis with rheumatoid factor of multiple sites without organ or systems involvement: Secondary | ICD-10-CM

## 2017-12-17 DIAGNOSIS — I5032 Chronic diastolic (congestive) heart failure: Secondary | ICD-10-CM | POA: Diagnosis not present

## 2017-12-17 DIAGNOSIS — I2510 Atherosclerotic heart disease of native coronary artery without angina pectoris: Secondary | ICD-10-CM | POA: Diagnosis not present

## 2017-12-17 DIAGNOSIS — E782 Mixed hyperlipidemia: Secondary | ICD-10-CM | POA: Diagnosis not present

## 2017-12-17 DIAGNOSIS — Z23 Encounter for immunization: Secondary | ICD-10-CM

## 2017-12-17 LAB — CBC
HEMATOCRIT: 43.7 % (ref 39.0–52.0)
Hemoglobin: 14.6 g/dL (ref 13.0–17.0)
MCHC: 33.4 g/dL (ref 30.0–36.0)
MCV: 89.5 fl (ref 78.0–100.0)
Platelets: 189 10*3/uL (ref 150.0–400.0)
RBC: 4.88 Mil/uL (ref 4.22–5.81)
RDW: 15.8 % — AB (ref 11.5–15.5)
WBC: 7.6 10*3/uL (ref 4.0–10.5)

## 2017-12-17 LAB — COMPREHENSIVE METABOLIC PANEL
ALK PHOS: 76 U/L (ref 39–117)
ALT: 15 U/L (ref 0–53)
AST: 17 U/L (ref 0–37)
Albumin: 3.7 g/dL (ref 3.5–5.2)
BILIRUBIN TOTAL: 0.7 mg/dL (ref 0.2–1.2)
BUN: 22 mg/dL (ref 6–23)
CALCIUM: 9.4 mg/dL (ref 8.4–10.5)
CO2: 28 meq/L (ref 19–32)
CREATININE: 1.33 mg/dL (ref 0.40–1.50)
Chloride: 104 mEq/L (ref 96–112)
GFR: 55.18 mL/min — ABNORMAL LOW (ref >60.00)
GLUCOSE: 121 mg/dL — AB (ref 70–99)
Potassium: 4.2 mEq/L (ref 3.5–5.1)
Sodium: 139 mEq/L (ref 135–145)
TOTAL PROTEIN: 6.9 g/dL (ref 6.0–8.3)

## 2017-12-17 LAB — SEDIMENTATION RATE: Sed Rate: 27 mm/hr — ABNORMAL HIGH (ref 0–20)

## 2017-12-17 MED ORDER — METHOTREXATE 2.5 MG PO TABS: 20.0000 mg | ORAL_TABLET | ORAL | 11 refills | Status: DC

## 2017-12-17 NOTE — Patient Instructions (Addendum)
Medication Instructions:  Your provider recommends that you continue on your current medications as directed. Please refer to the Current Medication list given to you today.   If you need a refill on your cardiac medications before your next appointment, please call your pharmacy.   Lab work: None  Testing/Procedures: None  Follow-Up: At [facility], you and your health needs are our priority.  As part of our continuing mission to provide you with exceptional heart care, we have created designated Provider Care Teams.  These Care Teams include your primary Cardiologist (physician) and Advanced Practice Providers (APPs -  Physician Assistants and Nurse Practitioners) who all work together to provide you with the care you need, when you need it. You will need a follow up appointment in:  12 months.  Please call our office 2 months in advance to schedule this appointment.  You may see Sherren Mocha, MD or one of the following Advanced Practice Providers on your designated Care Team: Richardson Dopp, PA-C [place], [place] . Daune Perch, NP

## 2017-12-17 NOTE — Assessment & Plan Note (Signed)
Still active with pain in hands and feet Will increase the MTX to 20mg  weekly Rheumatology referral to consider biologic Rx Check labs
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
## 2017-12-20 ENCOUNTER — Telehealth (INDEPENDENT_AMBULATORY_CARE_PROVIDER_SITE_OTHER): Payer: Self-pay | Admitting: Orthopaedic Surgery

## 2017-12-20 NOTE — Telephone Encounter (Signed)
Records faxed to [facility] [place] [phone]

## 2017-12-28 ENCOUNTER — Ambulatory Visit: Payer: PPO | Admitting: Podiatry

## 2017-12-28 DIAGNOSIS — Z09 Encounter for follow-up examination after completed treatment for conditions other than malignant neoplasm: Secondary | ICD-10-CM | POA: Diagnosis not present

## 2017-12-28 DIAGNOSIS — Z8669 Personal history of other diseases of the nervous system and sense organs: Secondary | ICD-10-CM | POA: Diagnosis not present

## 2018-01-18 ENCOUNTER — Ambulatory Visit: Payer: PPO | Admitting: Podiatry

## 2018-01-20 ENCOUNTER — Encounter

## 2018-01-25 DIAGNOSIS — B369 Superficial mycosis, unspecified: Secondary | ICD-10-CM | POA: Diagnosis not present

## 2018-01-25 DIAGNOSIS — H6241 Otitis externa in other diseases classified elsewhere, right ear: Secondary | ICD-10-CM | POA: Diagnosis not present

## 2018-02-01 ENCOUNTER — Encounter: Payer: Self-pay | Admitting: Internal Medicine

## 2018-02-01 DIAGNOSIS — M7989 Other specified soft tissue disorders: Secondary | ICD-10-CM | POA: Diagnosis not present

## 2018-02-01 DIAGNOSIS — M109 Gout, unspecified: Secondary | ICD-10-CM | POA: Diagnosis not present

## 2018-02-01 DIAGNOSIS — Z6830 Body mass index (BMI) 30.0-30.9, adult: Secondary | ICD-10-CM | POA: Diagnosis not present

## 2018-02-01 DIAGNOSIS — E669 Obesity, unspecified: Secondary | ICD-10-CM | POA: Diagnosis not present

## 2018-02-01 DIAGNOSIS — M0579 Rheumatoid arthritis with rheumatoid factor of multiple sites without organ or systems involvement: Secondary | ICD-10-CM | POA: Diagnosis not present

## 2018-02-08 ENCOUNTER — Ambulatory Visit: Payer: PPO | Admitting: Podiatry

## 2018-02-08 ENCOUNTER — Encounter: Payer: Self-pay | Admitting: Podiatry

## 2018-02-08 DIAGNOSIS — G5762 Lesion of plantar nerve, left lower limb: Secondary | ICD-10-CM | POA: Diagnosis not present

## 2018-02-08 DIAGNOSIS — G5761 Lesion of plantar nerve, right lower limb: Secondary | ICD-10-CM | POA: Diagnosis not present

## 2018-02-09 NOTE — Progress Notes (Signed)
He presents today states that his bilateral feet are still sore particularly around the toes he says is not so much these toes now as it is these as he points to the first and second.  Objective: Vital signs are stable he is alert and oriented x3 no pain on palpation to the third interdigital space that he does have pain on palpation to the first intermetatarsal space in the first interdigital space bilaterally.  Assessment: Most likely this is neuropathy this being accentuated by neuromas.  Plan: After sterile Betadine skin prep I injected 2 cc 4% dehydrated alcohol with local anesthetic into the first intermetatarsal space bilaterally.  I will follow-up with him in 3 to 4 weeks.  He tolerated procedure well without complications.

## 2018-02-16 ENCOUNTER — Ambulatory Visit: Payer: PPO | Admitting: Internal Medicine

## 2018-02-16 DIAGNOSIS — Z0289 Encounter for other administrative examinations: Secondary | ICD-10-CM

## 2018-02-18 ENCOUNTER — Encounter: Payer: Self-pay | Admitting: Internal Medicine

## 2018-02-18 ENCOUNTER — Ambulatory Visit (INDEPENDENT_AMBULATORY_CARE_PROVIDER_SITE_OTHER): Payer: PPO | Admitting: Internal Medicine

## 2018-02-18 VITALS — BP 128/76 | HR 55 | Temp 97.3°F | Ht 69.0 in | Wt 206.0 lb

## 2018-02-18 DIAGNOSIS — M0579 Rheumatoid arthritis with rheumatoid factor of multiple sites without organ or systems involvement: Secondary | ICD-10-CM

## 2018-02-18 NOTE — Assessment & Plan Note (Signed)
Some better with the steroid injection Stopping the MTX and starting some biologic Rx Will pass on care to the rheumatologist
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
## 2018-02-24 DIAGNOSIS — H6241 Otitis externa in other diseases classified elsewhere, right ear: Secondary | ICD-10-CM | POA: Diagnosis not present

## 2018-02-24 DIAGNOSIS — B369 Superficial mycosis, unspecified: Secondary | ICD-10-CM | POA: Diagnosis not present

## 2018-03-24 ENCOUNTER — Ambulatory Visit: Payer: PPO | Admitting: Podiatry

## 2018-03-24 ENCOUNTER — Encounter: Payer: Self-pay | Admitting: Podiatry

## 2018-03-24 DIAGNOSIS — G5762 Lesion of plantar nerve, left lower limb: Secondary | ICD-10-CM | POA: Diagnosis not present

## 2018-03-24 DIAGNOSIS — G5761 Lesion of plantar nerve, right lower limb: Secondary | ICD-10-CM

## 2018-03-24 NOTE — Progress Notes (Signed)
He presents today for follow-up of his neuropathy neuroma first and third metatarsal space bilaterally.  He states that it really is helping it has improved considerably.  Objective: Vital signs are stable alert oriented x3.  Pulses are palpable.  Neurologic sensorium is slightly diminished per Semmes Weinstein monofilament.  Deep tendon reflexes are normal and symmetrical bilateral.  He has mild pain to palpation to the deep peroneal nerve first intermetatarsal space bilateral.  Assessment: Deep peroneal nerve neuritis or neuroma.  Plan: Inject the area today with dehydrated alcohol after sterile Betadine skin prep tolerated procedure well without complications.  We will continue this as long as he is improving.

## 2018-03-25 DIAGNOSIS — H938X1 Other specified disorders of right ear: Secondary | ICD-10-CM | POA: Diagnosis not present

## 2018-04-07 ENCOUNTER — Encounter: Payer: Self-pay | Admitting: Internal Medicine

## 2018-04-07 ENCOUNTER — Ambulatory Visit (INDEPENDENT_AMBULATORY_CARE_PROVIDER_SITE_OTHER): Payer: PPO | Admitting: Internal Medicine

## 2018-04-07 VITALS — BP 122/68 | HR 77 | Temp 97.5°F | Wt 207.0 lb

## 2018-04-07 DIAGNOSIS — R067 Sneezing: Secondary | ICD-10-CM | POA: Diagnosis not present

## 2018-04-07 DIAGNOSIS — R0981 Nasal congestion: Secondary | ICD-10-CM
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
## 2018-04-07 NOTE — Patient Instructions (Signed)
Postnasal Drip  Postnasal drip is the feeling of mucus going down the back of your throat. Mucus is a slimy substance that moistens and cleans your nose and throat, as well as the air pockets in face bones near your forehead and cheeks (sinuses). Small amounts of mucus pass from your nose and sinuses down the back of your throat all the time. This is normal. When you produce too much mucus or the mucus gets too thick, you can feel it.  Some common causes of postnasal drip include:   Having more mucus because of:  ? A cold or the flu.  ? Allergies.  ? Cold air.  ? Certain medicines.   Having more mucus that is thicker because of:  ? A sinus or nasal infection.  ? Dry air.  ? A food allergy.  Follow these instructions at home:  Relieving discomfort     Gargle with a salt-water mixture 3-4 times a day or as needed. To make a salt-water mixture, completely dissolve -1 tsp of salt in 1 cup of warm water.   If the air in your home is dry, use a humidifier to add moisture to the air.   Use a saline spray or container (neti pot) to flush out the nose (nasal irrigation). These methods can help clear away mucus and keep the nasal passages moist.  General instructions   Take over-the-counter and prescription medicines only as told by your health care provider.   Follow instructions from your health care provider about eating or drinking restrictions. You may need to avoid caffeine.   Avoid things that you know you are allergic to (allergens), like dust, mold, pollen, pets, or certain foods.   Drink enough fluid to keep your urine pale yellow.   Keep all follow-up visits as told by your health care provider. This is important.  Contact a health care provider if:   You have a fever.   You have a sore throat.   You have difficulty swallowing.   You have headache.   You have sinus pain.   You have a cough that does not go away.   The mucus from your nose becomes thick and is green or yellow in color.   You have  cold or flu symptoms that last more than 10 days.  Summary   Postnasal drip is the feeling of mucus going down the back of your throat.   If your health care provider approves, use nasal irrigation or a nasal spray 2?4 times a day.   Avoid things that you know you are allergic to (allergens), like dust, mold, pollen, pets, or certain foods.  This information is not intended to replace advice given to you by your health care provider. Make sure you discuss any questions you have with your health care provider.  Document Released: 06/15/2016 Document Revised: 06/15/2016 Document Reviewed: 06/15/2016  Elsevier Interactive Patient Education  2019 [employer]vier Inc[employer]

## 2018-04-14 DIAGNOSIS — Z6829 Body mass index (BMI) 29.0-29.9, adult: Secondary | ICD-10-CM | POA: Diagnosis not present

## 2018-04-14 DIAGNOSIS — M109 Gout, unspecified: Secondary | ICD-10-CM | POA: Diagnosis not present

## 2018-04-14 DIAGNOSIS — E663 Overweight: Secondary | ICD-10-CM | POA: Diagnosis not present

## 2018-04-14 DIAGNOSIS — M0579 Rheumatoid arthritis with rheumatoid factor of multiple sites without organ or systems involvement: Secondary | ICD-10-CM | POA: Diagnosis not present

## 2018-04-14 DIAGNOSIS — M7989 Other specified soft tissue disorders: Secondary | ICD-10-CM | POA: Diagnosis not present

## 2018-04-28 ENCOUNTER — Encounter: Payer: Self-pay | Admitting: Podiatry

## 2018-04-28 ENCOUNTER — Ambulatory Visit (INDEPENDENT_AMBULATORY_CARE_PROVIDER_SITE_OTHER): Payer: PPO | Admitting: Podiatry

## 2018-04-28 DIAGNOSIS — H6121 Impacted cerumen, right ear: Secondary | ICD-10-CM | POA: Diagnosis not present

## 2018-04-28 DIAGNOSIS — H938X2 Other specified disorders of left ear: Secondary | ICD-10-CM | POA: Diagnosis not present

## 2018-04-28 DIAGNOSIS — G5762 Lesion of plantar nerve, left lower limb: Secondary | ICD-10-CM | POA: Diagnosis not present

## 2018-04-28 DIAGNOSIS — G5761 Lesion of plantar nerve, right lower limb: Secondary | ICD-10-CM

## 2018-04-28 NOTE — Progress Notes (Signed)
He presents today for follow-up of neuritis to the third interdigital space of the bilateral foot he states that is doing just great however he is noticed pain between the first and second toes and the second third toes bilaterally.  Objective: Vital signs stable he is alert and oriented x3.  Pulses palpable.  Neurologic sensorium is diminished per Semmes Weinstein monofilament.  He has pain on palpation to the first and second interdigital spaces bilaterally.  No pain on palpation to the third interspace bilaterally.  Assessment: Neuritis first and second interspace bilateral resolving neuritis third interspace bilaterally.  Plan: Discussed etiology pathology and surgical therapies at this point time went ahead and performed injection to the first and second interdigital spaces after sterile Betadine skin prep.  1 cc of dehydrated 4% alcohol was injected with local anesthetic.  These were injected first and second interdigital spaces.

## 2018-05-04 MED ORDER — METHYLPREDNISOLONE ACETATE 80 MG/ML IJ SUSP
80.0000 mg | Freq: Once | INTRAMUSCULAR | Status: AC
  Administered 2018-04-07: 80 mg via INTRAMUSCULAR

## 2018-05-04 NOTE — Addendum Note (Signed)
Addended by: Lindalou Hose Y on: 05/04/2018 09:00 AM   Modules accepted: Orders

## 2018-05-19 ENCOUNTER — Ambulatory Visit: Payer: PPO | Admitting: Podiatry

## 2018-05-19 DIAGNOSIS — H60391 Other infective otitis externa, right ear: Secondary | ICD-10-CM | POA: Diagnosis not present

## 2018-06-14 ENCOUNTER — Encounter: Payer: Self-pay | Admitting: Internal Medicine

## 2018-06-14 ENCOUNTER — Other Ambulatory Visit: Payer: Self-pay

## 2018-06-14 ENCOUNTER — Ambulatory Visit (INDEPENDENT_AMBULATORY_CARE_PROVIDER_SITE_OTHER): Payer: PPO | Admitting: Internal Medicine

## 2018-06-14 DIAGNOSIS — J45901 Unspecified asthma with (acute) exacerbation: Secondary | ICD-10-CM | POA: Diagnosis not present

## 2018-06-14 DIAGNOSIS — J449 Chronic obstructive pulmonary disease, unspecified: Secondary | ICD-10-CM

## 2018-06-14 MED ORDER — PREDNISONE 20 MG PO TABS: 40.0000 mg | ORAL_TABLET | Freq: Every day | ORAL | 0 refills | Status: DC

## 2018-06-14 MED ORDER — MONTELUKAST SODIUM 10 MG PO TABS: 10.0000 mg | ORAL_TABLET | Freq: Every day | ORAL | 3 refills | Status: AC
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
## 2018-06-14 NOTE — Assessment & Plan Note (Addendum)
Will give a 6 day burst of prednisone  If ongoing symptoms, the symbicort would be the appropriate next step

## 2018-06-14 NOTE — Assessment & Plan Note (Signed)
Seems to have component of allergic asthma with the pollen Will try montelukast (discussed the new black box warning) If not better by next week, will start symbicort

## 2018-07-21 ENCOUNTER — Encounter: Payer: Self-pay | Admitting: Podiatry

## 2018-07-21 ENCOUNTER — Other Ambulatory Visit: Payer: Self-pay

## 2018-07-21 ENCOUNTER — Ambulatory Visit: Payer: PPO | Admitting: Podiatry

## 2018-07-21 VITALS — Temp 97.7°F

## 2018-07-21 DIAGNOSIS — G5761 Lesion of plantar nerve, right lower limb: Secondary | ICD-10-CM | POA: Diagnosis not present

## 2018-07-21 DIAGNOSIS — G5762 Lesion of plantar nerve, left lower limb: Secondary | ICD-10-CM | POA: Diagnosis not present

## 2018-07-23 ENCOUNTER — Encounter: Payer: Self-pay | Admitting: Podiatry

## 2018-07-23 NOTE — Progress Notes (Signed)
He presents today states that since we have been having delay and office visits his fever started bother him considerably he is having pain beneath metatarsophalangeal joints and feels that he has lost a lot of the progress that we had made.  He states they are hurting again but it has been 2 months.  Objective: Vital signs are stable alert oriented x3.  Pulses are palpable.  Neuropathy is present he also has a palpable Mulder's click to the second and third interdigital spaces of bilateral foot hammertoe deformities also noted with plantarflexed metatarsals resulting in metatarsalgia and pain there.  Assessment: Neuromas neuralgias met plantarflexed metatarsals.  Plan: Discussed etiology pathology and surgical therapies at this point performed injection to the second interspace bilateral 2 cc of 4% dehydrated alcohol with local anesthetic after Betadine skin prep tolerated procedure well without complications.  Follow-up with him in 3 weeks

## 2018-08-11 ENCOUNTER — Encounter: Payer: Self-pay | Admitting: Podiatry

## 2018-08-11 ENCOUNTER — Other Ambulatory Visit: Payer: Self-pay

## 2018-08-11 ENCOUNTER — Ambulatory Visit: Payer: PPO | Admitting: Podiatry

## 2018-08-11 VITALS — Temp 97.3°F

## 2018-08-11 DIAGNOSIS — G5761 Lesion of plantar nerve, right lower limb: Secondary | ICD-10-CM | POA: Diagnosis not present

## 2018-08-11 DIAGNOSIS — G5762 Lesion of plantar nerve, left lower limb: Secondary | ICD-10-CM | POA: Diagnosis not present

## 2018-08-11 NOTE — Progress Notes (Signed)
He presents today's for follow-up of neuroma second and third interdigital spaces bilaterally he states that is doing so-so stating that is not as well as it has been in the past but is better than it was last visit.  Objective: Vital signs are stable he is alert and oriented x3.  He has pain on palpation to the third interdigital space bilaterally is worse than that of the second interdigital space bilaterally.  Pulses are palpable.  No open lesions or wounds are noted.  There is no skin break breakdown or eczematous dermatitis.  Assessment: Neuroma/neuritis second and third interdigital spaces bilaterally.  Plan: After sterile Betadine skin prep I injected 4% dehydrated alcohol total of 2 cc with local anesthetic into his third interdigital space bilaterally.  He tolerated procedure well without complications.  We will follow-up with him in about 3 weeks for another set of injections.

## 2018-09-01 ENCOUNTER — Encounter: Payer: Self-pay | Admitting: Podiatry

## 2018-09-01 ENCOUNTER — Ambulatory Visit: Payer: PPO | Admitting: Podiatry

## 2018-09-01 ENCOUNTER — Other Ambulatory Visit: Payer: Self-pay

## 2018-09-01 VITALS — Temp 98.2°F

## 2018-09-01 DIAGNOSIS — G5761 Lesion of plantar nerve, right lower limb: Secondary | ICD-10-CM

## 2018-09-01 DIAGNOSIS — G5762 Lesion of plantar nerve, left lower limb: Secondary | ICD-10-CM

## 2018-09-01 NOTE — Progress Notes (Signed)
He presents today for follow-up of neuroma neuritis second and third interspace bilaterally.  He states that is improved some.  Objective: Vital signs are stable alert and oriented x3.  Pulses are palpable.  Neurologic sensorium diminished and deep tendon reflexes are intact no open lesions or wounds are noted.  Assessment: Neuritis neuroma's second and third interdigital spaces bilaterally.  Plan: At this point I injected both of these areas bilaterally today after sterile Betadine skin prep I injected 2 cc 4% dehydrated alcohol with local anesthetic into each interdigital space.  I will follow-up with him in 3 to 4 weeks.

## 2018-09-02 ENCOUNTER — Ambulatory Visit: Payer: PPO | Admitting: Internal Medicine

## 2018-09-09 ENCOUNTER — Ambulatory Visit (INDEPENDENT_AMBULATORY_CARE_PROVIDER_SITE_OTHER): Payer: PPO | Admitting: Internal Medicine

## 2018-09-09 ENCOUNTER — Encounter: Payer: Self-pay | Admitting: Internal Medicine

## 2018-09-09 VITALS — Ht 71.0 in | Wt 186.0 lb

## 2018-09-09 DIAGNOSIS — N183 Chronic kidney disease, stage 3 unspecified: Secondary | ICD-10-CM | POA: Insufficient documentation

## 2018-09-09 DIAGNOSIS — M069 Rheumatoid arthritis, unspecified: Secondary | ICD-10-CM | POA: Diagnosis not present

## 2018-09-09 DIAGNOSIS — F324 Major depressive disorder, single episode, in partial remission: Secondary | ICD-10-CM | POA: Diagnosis not present

## 2018-09-09 DIAGNOSIS — I5032 Chronic diastolic (congestive) heart failure: Secondary | ICD-10-CM

## 2018-09-09 DIAGNOSIS — Z7189 Other specified counseling: Secondary | ICD-10-CM

## 2018-09-09 DIAGNOSIS — J439 Emphysema, unspecified: Secondary | ICD-10-CM | POA: Diagnosis not present

## 2018-09-09 DIAGNOSIS — I25119 Atherosclerotic heart disease of native coronary artery with unspecified angina pectoris: Secondary | ICD-10-CM | POA: Diagnosis not present

## 2018-09-09 DIAGNOSIS — Z0000 Encounter for general adult medical examination without abnormal findings: Secondary | ICD-10-CM | POA: Diagnosis not present

## 2018-09-09 MED ORDER — SERTRALINE HCL 100 MG PO TABS: 100.0000 mg | ORAL_TABLET | Freq: Every day | ORAL | 3 refills | Status: DC

## 2018-09-09 NOTE — Assessment & Plan Note (Signed)
Still very symptomatic Unclear if allergic component as well Inhaled steroid not really helping---will set up with pulmonary

## 2018-09-09 NOTE — Progress Notes (Signed)
Hearing Screening   125Hz  250Hz  500Hz  1000Hz  2000Hz  3000Hz  4000Hz  6000Hz  8000Hz   Right ear:           Left ear:           Comments: Has hearing aids  Vision Screening Comments: [facility] February 2020

## 2018-09-09 NOTE — Assessment & Plan Note (Signed)
No chest pain but chronic and sig DOE May be ischemic component Consider further evaluation depending on status after pulmonary appointment

## 2018-09-09 NOTE — Assessment & Plan Note (Signed)
Marked improvement on leflunomide
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
## 2018-09-09 NOTE — Assessment & Plan Note (Signed)
Mild He will get me copy of recent blood work at [place] to review

## 2018-09-09 NOTE — Assessment & Plan Note (Signed)
Weight down considerably Continues on the diuretic Not clear if his DOE could be related to this

## 2018-09-09 NOTE — Assessment & Plan Note (Signed)
Still very symptomatic with anhedonia Will increase the sertraline to 100mg  daily Follow up 2 months

## 2018-09-09 NOTE — Assessment & Plan Note (Addendum)
I have personally reviewed the Medicare Annual Wellness questionnaire and have noted 1. The patient's medical and social history 2. Their use of alcohol, tobacco or illicit drugs 3. Their current medications and supplements 4. The patient's functional ability including ADL's, fall risks, home safety risks and hearing or visual             impairment. 5. Diet and physical activities 6. Evidence for depression or mood disorders  The patients weight, height, BMI and visual acuity have been recorded in the chart I have made referrals, counseling and provided education to the patient based review of the above and I have provided the pt with a written personalized care plan for preventive services.  I have provided you with a copy of your personalized plan for preventive services. Please take the time to review along with your updated medication list.  Done with cancer screening Discussed exercise Flu vaccine in the fall Reviewed fall prevention--needs to use the cane due to balance/neuropathy issues 7 minutes spent on this--rest on E&M issues

## 2018-09-09 NOTE — Assessment & Plan Note (Signed)
Has DNR 
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
## 2018-09-15 ENCOUNTER — Ambulatory Visit (INDEPENDENT_AMBULATORY_CARE_PROVIDER_SITE_OTHER): Payer: PPO | Admitting: Internal Medicine

## 2018-09-15 DIAGNOSIS — R0609 Other forms of dyspnea: Secondary | ICD-10-CM

## 2018-09-15 DIAGNOSIS — J449 Chronic obstructive pulmonary disease, unspecified: Secondary | ICD-10-CM

## 2018-09-15 DIAGNOSIS — J4489 Other specified chronic obstructive pulmonary disease: Secondary | ICD-10-CM

## 2018-09-15 MED ORDER — SPIRIVA HANDIHALER 18 MCG IN CAPS: 18.0000 ug | ORAL_CAPSULE | Freq: Every day | RESPIRATORY_TRACT | 2 refills | Status: AC

## 2018-09-15 NOTE — Patient Instructions (Addendum)
Increase symbicort to 2 puffs twice per day. Rinse mouth after use.  Will start spiriva once daily.  Will send for lung function test and blood work.  Recommend that you start an over the counter antihistamine such as allegra (fexofenadine).

## 2018-09-21 DIAGNOSIS — H6121 Impacted cerumen, right ear: Secondary | ICD-10-CM | POA: Diagnosis not present

## 2018-09-28 ENCOUNTER — Telehealth: Payer: Self-pay | Admitting: Internal Medicine

## 2018-09-28 DIAGNOSIS — J439 Emphysema, unspecified: Secondary | ICD-10-CM

## 2018-09-28 NOTE — Telephone Encounter (Signed)
Order for CBC with Diff and BMP faxed to [facility] at fax number [phone].

## 2018-09-29 ENCOUNTER — Other Ambulatory Visit: Payer: Self-pay

## 2018-09-29 ENCOUNTER — Ambulatory Visit: Payer: PPO | Admitting: Podiatry

## 2018-09-29 ENCOUNTER — Encounter: Payer: Self-pay | Admitting: Podiatry

## 2018-09-29 VITALS — Temp 96.7°F

## 2018-09-29 DIAGNOSIS — G5762 Lesion of plantar nerve, left lower limb: Secondary | ICD-10-CM | POA: Diagnosis not present

## 2018-09-29 DIAGNOSIS — G5761 Lesion of plantar nerve, right lower limb: Secondary | ICD-10-CM

## 2018-09-30 ENCOUNTER — Telehealth: Payer: Self-pay | Admitting: *Deleted

## 2018-09-30 DIAGNOSIS — M109 Gout, unspecified: Secondary | ICD-10-CM

## 2018-09-30 DIAGNOSIS — R0989 Other specified symptoms and signs involving the circulatory and respiratory systems: Secondary | ICD-10-CM

## 2018-09-30 NOTE — Telephone Encounter (Signed)
-----   Message from Rip Harbour, [facility] sent at 09/29/2018 12:15 PM EDT ----- Regarding: Vascular ABI's and TBI's bilateral - decrease pulses - surgical consideration - vascular consult if abnormal

## 2018-09-30 NOTE — Telephone Encounter (Signed)
Orders and referral faxed to [facility].

## 2018-10-01 NOTE — Progress Notes (Signed)
He presents today for follow-up of pain to his bilateral lower extremities and for his neuromas.  States that the bottom of my feet are just killing me they are exquisitely painful.  But my toes are starting to feel much better particularly these as he refers to the second interdigital spaces.  I would like to see about getting these toes fixed.  Objective: Vital signs are stable he is alert and oriented x3.  Pulses are barely palpable severe hammertoe deformities are noted.  This is resulting in a plantar flexion of the metatarsals and the loss of the anterior fat pad of the metatarsals.  He also has palpable Mulder's click to the third interdigital space bilaterally less pain on palpation to the second interdigital spaces now.  Assessment: Continues to have neuromas and neuritis to the forefoot probably associated with the plantarflexed metatarsals and hammertoe deformities.  Plan: Discussed etiology pathology conservative versus surgical therapies at this point I went ahead and injected 2 cc 4% dehydrated alcohol to the third interdigital spaces today.  I also went ahead and scheduled him for vascular studies to be performed particularly ABIs bilaterally and also placed him in silicone metatarsal pads to help alleviate the forefoot pain.

## 2018-10-04 ENCOUNTER — Other Ambulatory Visit: Payer: Self-pay | Admitting: Internal Medicine

## 2018-10-04 DIAGNOSIS — J449 Chronic obstructive pulmonary disease, unspecified: Secondary | ICD-10-CM | POA: Diagnosis not present

## 2018-10-04 DIAGNOSIS — R0609 Other forms of dyspnea: Secondary | ICD-10-CM | POA: Diagnosis not present

## 2018-10-05 LAB — CBC WITH DIFFERENTIAL/PLATELET
Basophils Absolute: 0.1 10*3/uL (ref 0.0–0.2)
Basos: 2 %
EOS (ABSOLUTE): 0.3 10*3/uL (ref 0.0–0.4)
Eos: 4 %
Hematocrit: 44.9 % (ref 37.5–51.0)
Hemoglobin: 14.7 g/dL (ref 13.0–17.7)
Immature Grans (Abs): 0 10*3/uL (ref 0.0–0.1)
Immature Granulocytes: 0 %
Lymphocytes Absolute: 1.7 10*3/uL (ref 0.7–3.1)
Lymphs: 25 %
MCH: 28.8 pg (ref 26.6–33.0)
MCHC: 32.7 g/dL (ref 31.5–35.7)
MCV: 88 fL (ref 79–97)
Monocytes Absolute: 0.8 10*3/uL (ref 0.1–0.9)
Monocytes: 12 %
Neutrophils Absolute: 3.7 10*3/uL (ref 1.4–7.0)
Neutrophils: 57 %
Platelets: 201 10*3/uL (ref 150–450)
RBC: 5.11 x10E6/uL (ref 4.14–5.80)
RDW: 13.9 % (ref 11.6–15.4)
WBC: 6.6 10*3/uL (ref 3.4–10.8)

## 2018-10-05 LAB — BASIC METABOLIC PANEL
BUN/Creatinine Ratio: 13 (ref 10–24)
BUN: 15 mg/dL (ref 8–27)
CO2: 22 mmol/L (ref 20–29)
Calcium: 9 mg/dL (ref 8.6–10.2)
Chloride: 99 mmol/L (ref 96–106)
Creatinine, Ser: 1.18 mg/dL (ref 0.76–1.27)
GFR calc Af Amer: 67 mL/min/{1.73_m2} (ref >59)
GFR calc non Af Amer: 58 mL/min/{1.73_m2} — ABNORMAL LOW (ref >59)
Glucose: 105 mg/dL — ABNORMAL HIGH (ref 65–99)
Potassium: 4 mmol/L (ref 3.5–5.2)
Sodium: 139 mmol/L (ref 134–144)

## 2018-10-07 ENCOUNTER — Encounter (HOSPITAL_COMMUNITY): Payer: Self-pay

## 2018-10-07 ENCOUNTER — Ambulatory Visit (HOSPITAL_COMMUNITY): Payer: PPO

## 2018-10-10 ENCOUNTER — Telehealth: Payer: Self-pay | Admitting: Internal Medicine

## 2018-10-10 NOTE — Telephone Encounter (Signed)
Pt dropped off lab results. Placed on cart. Patient also dropped off parking placard form to be filled out. Placed in rx tower.

## 2018-10-10 NOTE — Telephone Encounter (Signed)
Placed in Dr. Alla German inbox for review when he returns

## 2018-10-11 NOTE — Telephone Encounter (Signed)
I will address next week when I return

## 2018-10-12 ENCOUNTER — Other Ambulatory Visit: Payer: Self-pay

## 2018-10-14 ENCOUNTER — Other Ambulatory Visit: Payer: Self-pay

## 2018-10-20 ENCOUNTER — Encounter: Payer: Self-pay | Admitting: Podiatry

## 2018-10-20 ENCOUNTER — Ambulatory Visit (INDEPENDENT_AMBULATORY_CARE_PROVIDER_SITE_OTHER): Payer: PPO | Admitting: Podiatry

## 2018-10-20 ENCOUNTER — Other Ambulatory Visit: Payer: Self-pay

## 2018-10-20 VITALS — Temp 97.9°F

## 2018-10-20 DIAGNOSIS — G5761 Lesion of plantar nerve, right lower limb: Secondary | ICD-10-CM | POA: Diagnosis not present

## 2018-10-20 DIAGNOSIS — G5762 Lesion of plantar nerve, left lower limb: Secondary | ICD-10-CM

## 2018-10-20 NOTE — Progress Notes (Signed)
He presents today for a follow-up of his neuritis bilateral third interdigital space.  States that is improving considerably.  Objective: Vital signs are stable he is alert and oriented x3.  Pulses are palpable.  He still has pain on palpation to the third interspace bilateral with palpable Mulder's click.  Assessment: Neuroma third interdigital space bilateral.  Plan: Injected dehydrated alcohol today 2 cc 4% dehydrated alcohol to the third interspace of the bilateral foot tolerated procedure well without complications.  I will follow-up with him in the near future after he goes to the vascular doctor for evaluation for surgical consideration.

## 2018-10-21 ENCOUNTER — Telehealth: Payer: Self-pay | Admitting: Internal Medicine

## 2018-10-21 NOTE — Telephone Encounter (Signed)
I do know that I put the form in Dr Alla German inbox after I filled out the parts I could do. Once he signs things, they go up front. I don't usually see them after that. Spoke to pt. Dr Silvio Pate did not sign it until he returned to the office on 10-17-18. I advised him that our mail goes to a centralized location and then sent out. It can take 4-5 days at times. If he does not get it in the next few days, he will call us back.

## 2018-10-21 NOTE — Telephone Encounter (Signed)
Patient called and stated that he was suppose to receive paperwork in the mail for him to get a handicap plaque for his car.  He stated he spoke to someone from the office on 7/27 who told him it was being put in the mail. Patient wanted to make sure this has been mailed since he has not received it yet,    C/B # [phone]

## 2018-11-03 DIAGNOSIS — M2041 Other hammer toe(s) (acquired), right foot: Secondary | ICD-10-CM | POA: Diagnosis not present

## 2018-11-03 DIAGNOSIS — G5762 Lesion of plantar nerve, left lower limb: Secondary | ICD-10-CM | POA: Diagnosis not present

## 2018-11-03 DIAGNOSIS — M2042 Other hammer toe(s) (acquired), left foot: Secondary | ICD-10-CM | POA: Diagnosis not present

## 2018-11-03 DIAGNOSIS — G629 Polyneuropathy, unspecified: Secondary | ICD-10-CM | POA: Diagnosis not present

## 2018-11-03 DIAGNOSIS — G5761 Lesion of plantar nerve, right lower limb: Secondary | ICD-10-CM | POA: Diagnosis not present

## 2018-11-03 DIAGNOSIS — G603 Idiopathic progressive neuropathy: Secondary | ICD-10-CM | POA: Diagnosis not present

## 2018-11-07 ENCOUNTER — Telehealth: Payer: Self-pay | Admitting: Internal Medicine

## 2018-11-07 ENCOUNTER — Other Ambulatory Visit: Admission: RE | Admit: 2018-11-07 | Payer: PPO | Source: Ambulatory Visit

## 2018-11-07 ENCOUNTER — Other Ambulatory Visit: Payer: Self-pay

## 2018-11-07 NOTE — Telephone Encounter (Signed)
Pt is aware of date/time of covid testing. Nothing further is needed.  

## 2018-11-08 ENCOUNTER — Ambulatory Visit (INDEPENDENT_AMBULATORY_CARE_PROVIDER_SITE_OTHER): Payer: PPO | Admitting: Internal Medicine

## 2018-11-08 ENCOUNTER — Other Ambulatory Visit: Payer: Self-pay

## 2018-11-08 ENCOUNTER — Encounter: Payer: Self-pay | Admitting: Internal Medicine

## 2018-11-08 VITALS — BP 122/72 | HR 59 | Temp 98.4°F | Ht 71.0 in | Wt 193.0 lb

## 2018-11-08 DIAGNOSIS — F324 Major depressive disorder, single episode, in partial remission: Secondary | ICD-10-CM

## 2018-11-08 DIAGNOSIS — Z23 Encounter for immunization: Secondary | ICD-10-CM
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
## 2018-11-08 NOTE — Assessment & Plan Note (Signed)
May be some better with the increased dose Some degree of complicated grieving, chronic dysthymia also and anxiety No change for now He will let me know if things worsen

## 2018-11-09 ENCOUNTER — Other Ambulatory Visit
Admission: RE | Admit: 2018-11-09 | Discharge: 2018-11-09 | Disposition: A | Discharge status: Home / Self Care | Payer: PPO | Source: Ambulatory Visit | Attending: Internal Medicine | Admitting: Internal Medicine

## 2018-11-09 DIAGNOSIS — Z20828 Contact with and (suspected) exposure to other viral communicable diseases: Secondary | ICD-10-CM | POA: Diagnosis not present

## 2018-11-09 DIAGNOSIS — Z01812 Encounter for preprocedural laboratory examination: Secondary | ICD-10-CM | POA: Insufficient documentation

## 2018-11-09 LAB — SARS CORONAVIRUS 2 (TAT 6-24 HRS): SARS Coronavirus 2: NEGATIVE

## 2018-11-10 ENCOUNTER — Ambulatory Visit: Payer: PPO | Attending: Internal Medicine

## 2018-11-10 ENCOUNTER — Telehealth: Payer: Self-pay | Admitting: Internal Medicine

## 2018-11-10 ENCOUNTER — Ambulatory Visit: Payer: PPO | Admitting: Podiatry

## 2018-11-10 ENCOUNTER — Other Ambulatory Visit: Payer: Self-pay

## 2018-11-10 DIAGNOSIS — R0609 Other forms of dyspnea: Secondary | ICD-10-CM | POA: Diagnosis not present

## 2018-11-10 DIAGNOSIS — J4540 Moderate persistent asthma, uncomplicated: Secondary | ICD-10-CM

## 2018-11-10 DIAGNOSIS — J449 Chronic obstructive pulmonary disease, unspecified: Secondary | ICD-10-CM | POA: Diagnosis not present

## 2018-11-11 NOTE — Progress Notes (Deleted)
[facility]    Assessment and Plan:  Chronic bronchitis with dyspnea on exertion. - Review of chest x-ray shows hyperinflation suggestive of COPD/emphysema. - Asked to increase Symbicort to twice daily, added Spiriva.  Dyspnea on exertion. - Patient notes that his breathing got worse with increase in pollen, which suggests an asthmatic/allergic component. - We will order PFTs look for reversibility, CBC with differential. - Patient is already on Singulair, will he is asked to start an over-the-counter antihistamine.  No orders of the defined types were placed in this encounter.  No orders of the defined types were placed in this encounter.  No follow-ups on file.   Date: 11/11/2018  MRN# [id number] Jonathan Glass [birth year]   Jonathan Glass is a 77 y.o. old male seen in consultation for chief complaint of: dyspnea.     HPI:  HELIODORO Glass is a 78 y.o. old male, he was last seen with symptoms of dyspnea, suspicious for COPD/emphysema.  He also appeared to have component of allergic asthma.  He underwent PFT which did not show significant COPD.  At last visit he was started empirically on Spiriva in addition to Symbicort. At the end of February he started having trouble breathing which had progressed over the years. He noted that the pollen made his breathing worse in Feb and has stayed that way until now. He was started on symbicort at that time. He has not been tried on other inhalers.  He has been tested for allergies several years ago does not recall anything significant.  He has no pets. He denies reflux or sinus drainage.   He last smoked in the 80's, his wife smoked until she passed 7 yrs ago, his parents smoke. He is retired/disabled from the [employer].  He sees a cardiologist yearly. He has never had a lung function test.   **PFT 11/10/2018>> tracings personally reviewed.  FVC is 83% predicted, FEV1 is 79% predicted, there is no significant  improvement bronchodilator.  Ratio is 94%.  TLC is 98% predicted, ratio is 119%, flow volume loop is normal, DLCO 63%.  Overall this PFT shows normal pulmonary function without evidence of significant obstructive lung disease, though there is mild hyperinflation suggestive of mild disease. **CBC 10/04/2018>> AEC 300. **Chest x-ray 04/29/2016>> imaging personally reviewed, mild hyperinflation suggestive of emphysema. **Echocardiogram 05/29/2014>> "unremarkable echo study"  Medication:    Current Outpatient Medications:  .  albuterol (PROVENTIL HFA;VENTOLIN HFA) 108 (90 Base) MCG/ACT inhaler, Inhale 2 puffs into the lungs every 6 (six) hours as needed for wheezing or shortness of breath., Disp: , Rfl:  .  amLODipine (NORVASC) 10 MG tablet, Take 10 mg by mouth daily., Disp: , Rfl:  .  aspirin EC 81 MG tablet, Take 81 mg by mouth daily., Disp: , Rfl:  .  budesonide-formoterol (SYMBICORT) 160-4.5 MCG/ACT inhaler, Inhale 2 puffs into the lungs 2 (two) times daily., Disp: , Rfl:  .  colchicine (COLCRYS) 0.6 MG tablet, Take 1 tablet (0.6 mg total) by mouth daily., Disp: 90 tablet, Rfl: 3 .  diazepam (VALIUM) 2 MG tablet, Take 1 tablet (2 mg total) by mouth 3 (three) times daily as needed for anxiety., Disp: 90 tablet, Rfl: 0 .  ezetimibe (ZETIA) 10 MG tablet, Take 5 mg by mouth daily. , Disp: , Rfl:  .  fluticasone (FLONASE) 50 MCG/ACT nasal spray, Place 2 sprays into the nose as needed for allergies or rhinitis. , Disp: , Rfl:  .  furosemide (LASIX) 40 MG  tablet, Take 40 mg by mouth daily. , Disp: , Rfl:  .  gabapentin (NEURONTIN) 300 MG capsule, Take 1-2 capsules (300-600 mg total) by mouth at bedtime., Disp: 180 capsule, Rfl: 3 .  leflunomide (ARAVA) 20 MG tablet, , Disp: , Rfl:  .  montelukast (SINGULAIR) 10 MG tablet, Take 1 tablet (10 mg total) by mouth at bedtime., Disp: 30 tablet, Rfl: 3 .  omeprazole (PRILOSEC) 20 MG capsule, Take 1 capsule (20 mg total) by mouth daily., Disp: 90 capsule, Rfl: 3 .   sertraline (ZOLOFT) 100 MG tablet, Take 1 tablet (100 mg total) by mouth daily., Disp: 30 tablet, Rfl: 3 .  tiotropium (SPIRIVA HANDIHALER) 18 MCG inhalation capsule, Place 1 capsule (18 mcg total) into inhaler and inhale daily., Disp: 30 capsule, Rfl: 2   Allergies:  Statins, Lyrica [pregabalin], and Tramadol hcl  Review of Systems: Gen:  Denies  fever, sweats, chills HEENT: Denies blurred vision, double vision. bleeds, sore throat Cvc:  No dizziness, chest pain. Resp:   Denies cough or sputum production, shortness of breath Gi: Denies swallowing difficulty, stomach pain. Gu:  Denies bladder incontinence, burning urine Ext:   No Joint pain, stiffness. Skin: No skin rash,  hives  Endoc:  No polyuria, polydipsia. Psych: No depression, insomnia. Other:  All other systems were reviewed with the patient and were negative other that what is mentioned in the HPI.   Physical Examination:  --    LABORATORY PANEL:   CBC No results for input(s): WBC, HGB, HCT, PLT in the last 168 hours. ------------------------------------------------------------------------------------------------------------------  Chemistries  No results for input(s): NA, K, CL, CO2, GLUCOSE, BUN, CREATININE, CALCIUM, MG, AST, ALT, ALKPHOS, BILITOT in the last 168 hours.  Invalid input(s): GFRCGP ------------------------------------------------------------------------------------------------------------------  Cardiac Enzymes No results for input(s): TROPONINI in the last 168 hours. ------------------------------------------------------------  RADIOLOGY:  No results found.     Thank  you for the consultation and for allowing [facility] to assist in the care of your patient. Our recommendations are noted above.  Please contact us if we can be of further service.   Marda Stalker, M.D., F.C.C.P.  Board Certified in Internal Medicine, Pulmonary Medicine, [place],  and Sleep Medicine.   Pulmonary and Critical Care Office Number: [phone]   11/11/2018

## 2018-11-14 ENCOUNTER — Ambulatory Visit: Payer: PPO | Admitting: Internal Medicine

## 2018-11-18 NOTE — Progress Notes (Signed)
[facility]    Assessment and Plan:  Severe asthma with chronic bronchitis. - Continued symptoms despite maximal therapy with Spiriva, Symbicort, Ventolin. - We will check Rast, IgE.  Patient does have mild eosinophilia with an absolute eosinophil count of 300, and may benefit from treatment with biologic agent such as Dupixent if all other testing is negative.  Dyspnea on exertion. - We will check chest x-ray. - Has a history of severe symptomatic GERD, currently not symptomatic will restart omeprazole empirically. - Patient is already on Singulair, he cannot take antihistamines due to a history of anuria.  Orders Placed This Encounter  Procedures  . DG Chest 2 View  . Allergens w/Total IgE Area 2  . IgE   Meds ordered this encounter  Medications  . omeprazole (PRILOSEC) 20 MG capsule    Sig: Take 1 capsule (20 mg total) by mouth daily.    Dispense:  30 capsule    Refill:  11   Return in about 3 months (around 02/21/2019).  Greater than 50% of the 25 minute visit was spent in counseling/coordination of care regarding dyspnea.    Date: 11/18/2018  MRN# [id number] Jonathan Glass 17-Nov-1940   Jonathan Glass is a 78 y.o. old male seen in consultation for chief complaint of: dyspnea.     HPI:  Jonathan Glass is a 78 y.o. old male, he was last seen with symptoms of dyspnea, suspicious for COPD/emphysema.  At the end of February 2020 he started having trouble breathing which had progressed over the years. He noted that the pollen made his breathing worse in Feb and has stayed that way until now. He was started on symbicort at that time. He has not been tried on other inhalers.  He has been tested for allergies several years ago does not recall anything significant.  He has no pets. He denies reflux or sinus drainage.   He also appeared to have component of allergic asthma. Since last visit,  He underwent PFT which did not show significant COPD, he was  started empirically on Spiriva in addition to Symbicort and asked to use an OTC antihistamine.  He has remained on Spiriva and Symbicort, he feels that they help his breathing, however he has continued to use his albuterol about 3 times per day, which also helps.  He tells me has a history of severe reflux which resolved about 3 years ago, he is not using omeprazole now, he currently has no reflux symptoms. He is lost about 100 pounds over a 1 year.  Ending about 3 months ago, he is not lost weight in the past 3 months.  He tells me he has had an extensive work-up which is been unrevealing.  His most recent chest x-ray in our system is from February.  He denies sinus drainage.  He has a few cats in the house which he has had for several years.  He does not take antihistamines, he took one a few years ago and it caused anuria.  He last smoked in the 80's, his wife smoked until she passed 7 yrs ago, his parents smoke. He is retired/disabled from the [employer].  He sees a cardiologist yearly. He has never had a lung function test.   **PFT 11/10/2018>> tracings personally reviewed.  FVC is 83% predicted, FEV1 is 79% predicted, there is no significant improvement bronchodilator.  Ratio is 94%.  TLC is 98% predicted, ratio is 119%, flow volume loop is normal, DLCO 63%.  Overall this PFT shows normal pulmonary function without evidence of significant obstructive lung disease, though there is mild hyperinflation suggestive of mild disease. **CBC 10/04/2018>> AEC 300. **Chest x-ray 04/29/2016>> imaging personally reviewed, mild hyperinflation suggestive of emphysema. **Echocardiogram 05/29/2014>> "unremarkable echo study"  Medication:    Current Outpatient Medications:  .  albuterol (PROVENTIL HFA;VENTOLIN HFA) 108 (90 Base) MCG/ACT inhaler, Inhale 2 puffs into the lungs every 6 (six) hours as needed for wheezing or shortness of breath., Disp: , Rfl:  .  amLODipine (NORVASC) 10 MG tablet, Take 10 mg by mouth  daily., Disp: , Rfl:  .  aspirin EC 81 MG tablet, Take 81 mg by mouth daily., Disp: , Rfl:  .  budesonide-formoterol (SYMBICORT) 160-4.5 MCG/ACT inhaler, Inhale 2 puffs into the lungs 2 (two) times daily., Disp: , Rfl:  .  colchicine (COLCRYS) 0.6 MG tablet, Take 1 tablet (0.6 mg total) by mouth daily., Disp: 90 tablet, Rfl: 3 .  diazepam (VALIUM) 2 MG tablet, Take 1 tablet (2 mg total) by mouth 3 (three) times daily as needed for anxiety., Disp: 90 tablet, Rfl: 0 .  ezetimibe (ZETIA) 10 MG tablet, Take 5 mg by mouth daily. , Disp: , Rfl:  .  fluticasone (FLONASE) 50 MCG/ACT nasal spray, Place 2 sprays into the nose as needed for allergies or rhinitis. , Disp: , Rfl:  .  furosemide (LASIX) 40 MG tablet, Take 40 mg by mouth daily. , Disp: , Rfl:  .  gabapentin (NEURONTIN) 300 MG capsule, Take 1-2 capsules (300-600 mg total) by mouth at bedtime., Disp: 180 capsule, Rfl: 3 .  leflunomide (ARAVA) 20 MG tablet, , Disp: , Rfl:  .  montelukast (SINGULAIR) 10 MG tablet, Take 1 tablet (10 mg total) by mouth at bedtime., Disp: 30 tablet, Rfl: 3 .  omeprazole (PRILOSEC) 20 MG capsule, Take 1 capsule (20 mg total) by mouth daily., Disp: 90 capsule, Rfl: 3 .  sertraline (ZOLOFT) 100 MG tablet, Take 1 tablet (100 mg total) by mouth daily., Disp: 30 tablet, Rfl: 3 .  tiotropium (SPIRIVA HANDIHALER) 18 MCG inhalation capsule, Place 1 capsule (18 mcg total) into inhaler and inhale daily., Disp: 30 capsule, Rfl: 2   Allergies:  Statins, Lyrica [pregabalin], and Tramadol hcl    LABORATORY PANEL:   CBC No results for input(s): WBC, HGB, HCT, PLT in the last 168 hours. ------------------------------------------------------------------------------------------------------------------  Chemistries  No results for input(s): NA, K, CL, CO2, GLUCOSE, BUN, CREATININE, CALCIUM, MG, AST, ALT, ALKPHOS, BILITOT in the last 168 hours.  Invalid input(s): GFRCGP  ------------------------------------------------------------------------------------------------------------------  Cardiac Enzymes No results for input(s): TROPONINI in the last 168 hours. ------------------------------------------------------------  RADIOLOGY:  No results found.     Thank  you for the consultation and for allowing [facility] to assist in the care of your patient. Our recommendations are noted above.  Please contact us if we can be of further service.   Marda Stalker, M.D., F.C.C.P.  Board Certified in Internal Medicine, Pulmonary Medicine, [place], and Sleep Medicine.  [facility] Office Number: [phone]   11/18/2018

## 2018-11-22 ENCOUNTER — Other Ambulatory Visit: Payer: Self-pay

## 2018-11-22 ENCOUNTER — Ambulatory Visit: Payer: PPO | Admitting: Internal Medicine

## 2018-11-22 ENCOUNTER — Encounter: Payer: Self-pay | Admitting: Internal Medicine

## 2018-11-22 VITALS — BP 122/70 | HR 82 | Temp 97.4°F | Ht 71.0 in | Wt 191.6 lb

## 2018-11-22 DIAGNOSIS — J4540 Moderate persistent asthma, uncomplicated: Secondary | ICD-10-CM | POA: Diagnosis not present

## 2018-11-22 MED ORDER — OMEPRAZOLE 20 MG PO CPDR: 20.0000 mg | DELAYED_RELEASE_CAPSULE | Freq: Every day | ORAL | 11 refills | Status: DC

## 2018-11-22 NOTE — Patient Instructions (Signed)
Restart omeprazole once daily for possible reflux which can make asthma worse. We will check a chest x-ray and blood work.

## 2018-11-24 DIAGNOSIS — G5761 Lesion of plantar nerve, right lower limb: Secondary | ICD-10-CM | POA: Diagnosis not present

## 2018-11-24 DIAGNOSIS — G5762 Lesion of plantar nerve, left lower limb: Secondary | ICD-10-CM | POA: Diagnosis not present

## 2018-11-24 DIAGNOSIS — G603 Idiopathic progressive neuropathy: Secondary | ICD-10-CM | POA: Diagnosis not present

## 2018-11-24 DIAGNOSIS — M2041 Other hammer toe(s) (acquired), right foot: Secondary | ICD-10-CM | POA: Diagnosis not present

## 2018-11-24 DIAGNOSIS — M2042 Other hammer toe(s) (acquired), left foot: Secondary | ICD-10-CM | POA: Diagnosis not present

## 2018-11-30 ENCOUNTER — Encounter: Payer: Self-pay | Admitting: Cardiovascular Disease

## 2018-11-30 NOTE — Telephone Encounter (Signed)
Error

## 2018-11-30 NOTE — Telephone Encounter (Signed)
The patient called back. He stated he could not make tomorrow due to another appointment. Scheduled him this Friday with Dr. Burt Knack. He was grateful for assistance.

## 2018-12-02 ENCOUNTER — Encounter: Payer: Self-pay | Admitting: Cardiovascular Disease

## 2018-12-02 ENCOUNTER — Other Ambulatory Visit: Payer: Self-pay

## 2018-12-02 ENCOUNTER — Ambulatory Visit (INDEPENDENT_AMBULATORY_CARE_PROVIDER_SITE_OTHER): Payer: PPO | Admitting: Cardiovascular Disease

## 2018-12-02 VITALS — BP 116/60 | HR 77 | Ht 71.0 in | Wt 188.1 lb

## 2018-12-02 DIAGNOSIS — I2510 Atherosclerotic heart disease of native coronary artery without angina pectoris: Secondary | ICD-10-CM | POA: Diagnosis not present

## 2018-12-02 DIAGNOSIS — E782 Mixed hyperlipidemia: Secondary | ICD-10-CM | POA: Diagnosis not present

## 2018-12-02 DIAGNOSIS — I10 Essential (primary) hypertension: Secondary | ICD-10-CM | POA: Diagnosis not present
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
## 2018-12-02 NOTE — Patient Instructions (Signed)

## 2018-12-05 DIAGNOSIS — H26492 Other secondary cataract, left eye: Secondary | ICD-10-CM | POA: Diagnosis not present

## 2018-12-05 DIAGNOSIS — H26491 Other secondary cataract, right eye: Secondary | ICD-10-CM | POA: Diagnosis not present

## 2018-12-08 DIAGNOSIS — G5761 Lesion of plantar nerve, right lower limb: Secondary | ICD-10-CM | POA: Diagnosis not present

## 2018-12-14 DIAGNOSIS — B369 Superficial mycosis, unspecified: Secondary | ICD-10-CM | POA: Diagnosis not present

## 2018-12-14 DIAGNOSIS — H6241 Otitis externa in other diseases classified elsewhere, right ear: Secondary | ICD-10-CM | POA: Diagnosis not present

## 2018-12-20 DIAGNOSIS — H26492 Other secondary cataract, left eye: Secondary | ICD-10-CM | POA: Diagnosis not present

## 2018-12-22 DIAGNOSIS — G5762 Lesion of plantar nerve, left lower limb: Secondary | ICD-10-CM | POA: Diagnosis not present

## 2018-12-22 DIAGNOSIS — G603 Idiopathic progressive neuropathy: Secondary | ICD-10-CM | POA: Diagnosis not present

## 2018-12-22 DIAGNOSIS — G5761 Lesion of plantar nerve, right lower limb: Secondary | ICD-10-CM | POA: Diagnosis not present

## 2019-02-06 ENCOUNTER — Other Ambulatory Visit: Payer: Self-pay

## 2019-03-08 DIAGNOSIS — H60391 Other infective otitis externa, right ear: Secondary | ICD-10-CM | POA: Diagnosis not present

## 2019-03-16 ENCOUNTER — Encounter

## 2019-04-05 DIAGNOSIS — B369 Superficial mycosis, unspecified: Secondary | ICD-10-CM | POA: Diagnosis not present

## 2019-04-05 DIAGNOSIS — H6241 Otitis externa in other diseases classified elsewhere, right ear: Secondary | ICD-10-CM | POA: Diagnosis not present

## 2019-04-25 ENCOUNTER — Telehealth: Payer: Self-pay | Admitting: Internal Medicine

## 2019-04-25 NOTE — Progress Notes (Signed)
°  Chronic Care Management   Outreach Note  04/25/2019 Name: Jonathan Glass MRN: [id number] DOB: 18-Mar-1940  Referred by: [name], MD Reason for referral : No chief complaint on file.   An unsuccessful telephone outreach was attempted today. The patient was referred to the pharmacist for assistance with care management and care coordination.   Follow Up Plan:   Raynicia Dukes [employer] Scheduler

## 2019-05-02 IMAGING — DX DG HAND 2V*R*
2 series · 2 of 2 positions shown · non-contrast
Comparison: None.

CLINICAL DATA: Rheumatoid arthritis.

EXAM:
RIGHT HAND - 2 VIEW

[hand ap]
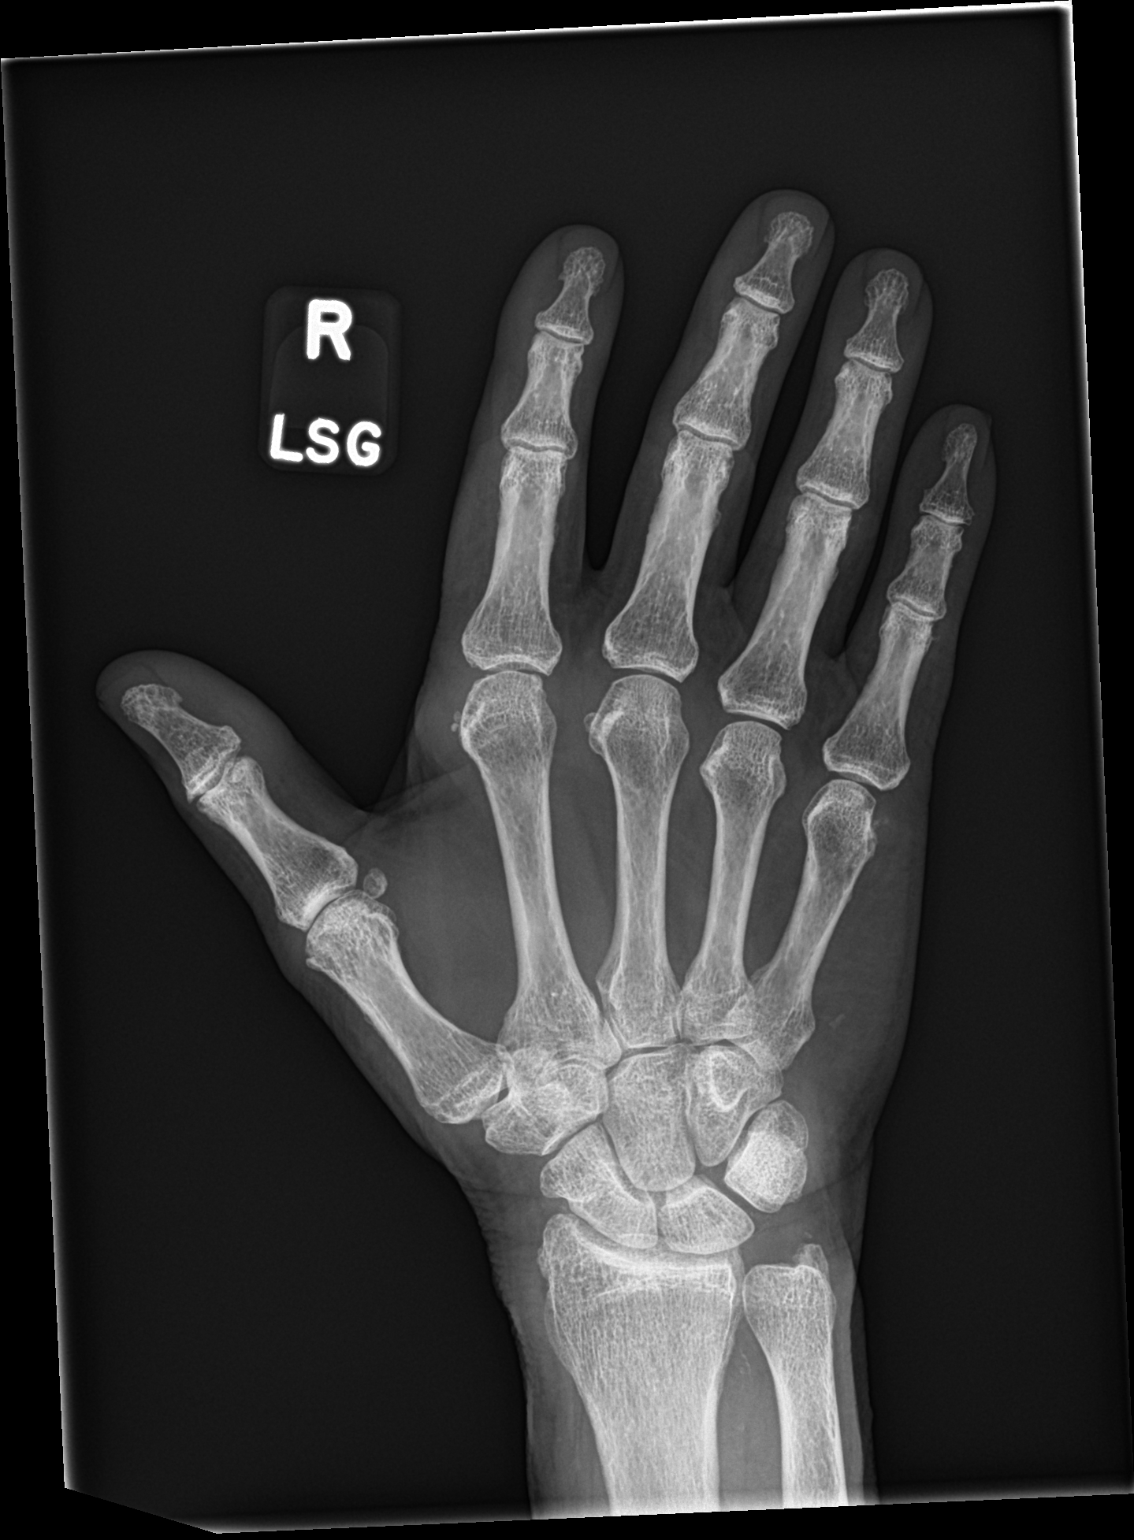

[hand lat]
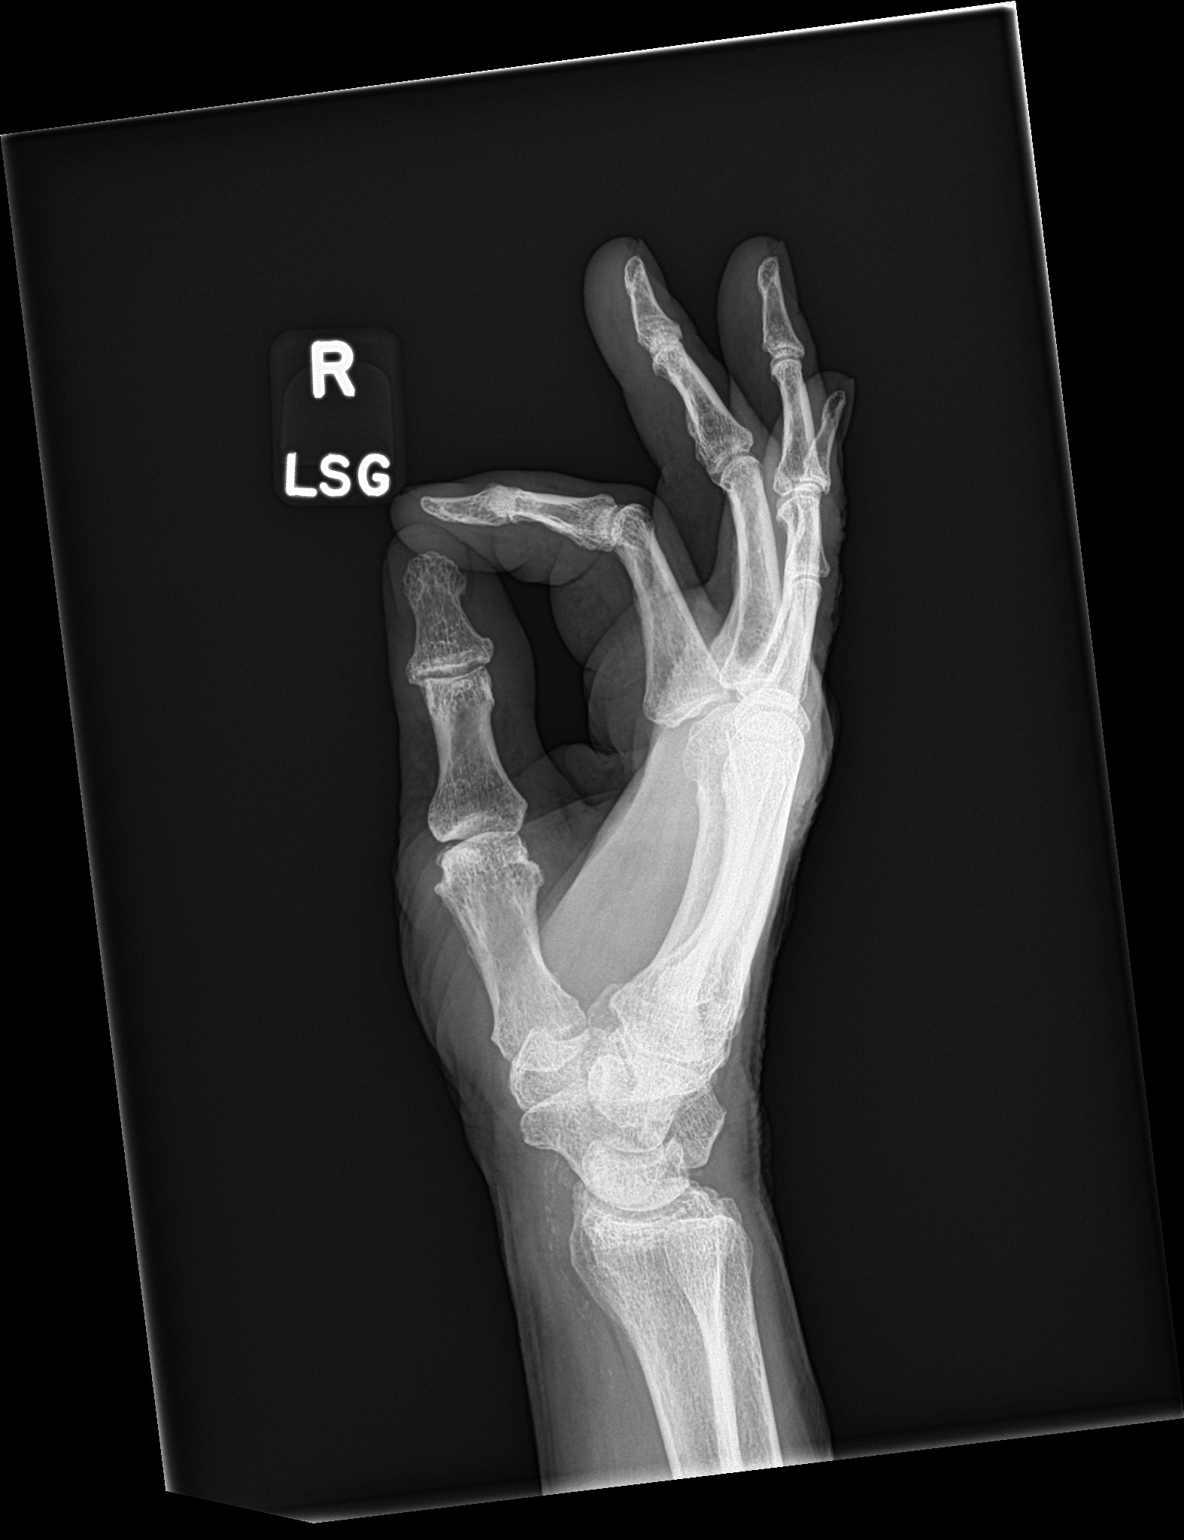

[2 of 2 positions shown; findings below may reference images not displayed]

FINDINGS: There is no evidence of fracture or dislocation. There is no
evidence of arthropathy or other focal bone abnormality. Soft
tissues are unremarkable.
IMPRESSION: No definite abnormality seen in the right hand.

## 2019-05-02 IMAGING — DX DG HAND 2V*L*
2 series · 2 of 2 positions shown · non-contrast
Comparison: No recent prior.

CLINICAL DATA: Rheumatoid arthritis.

EXAM:
LEFT HAND - 2 VIEW

[hand ap]
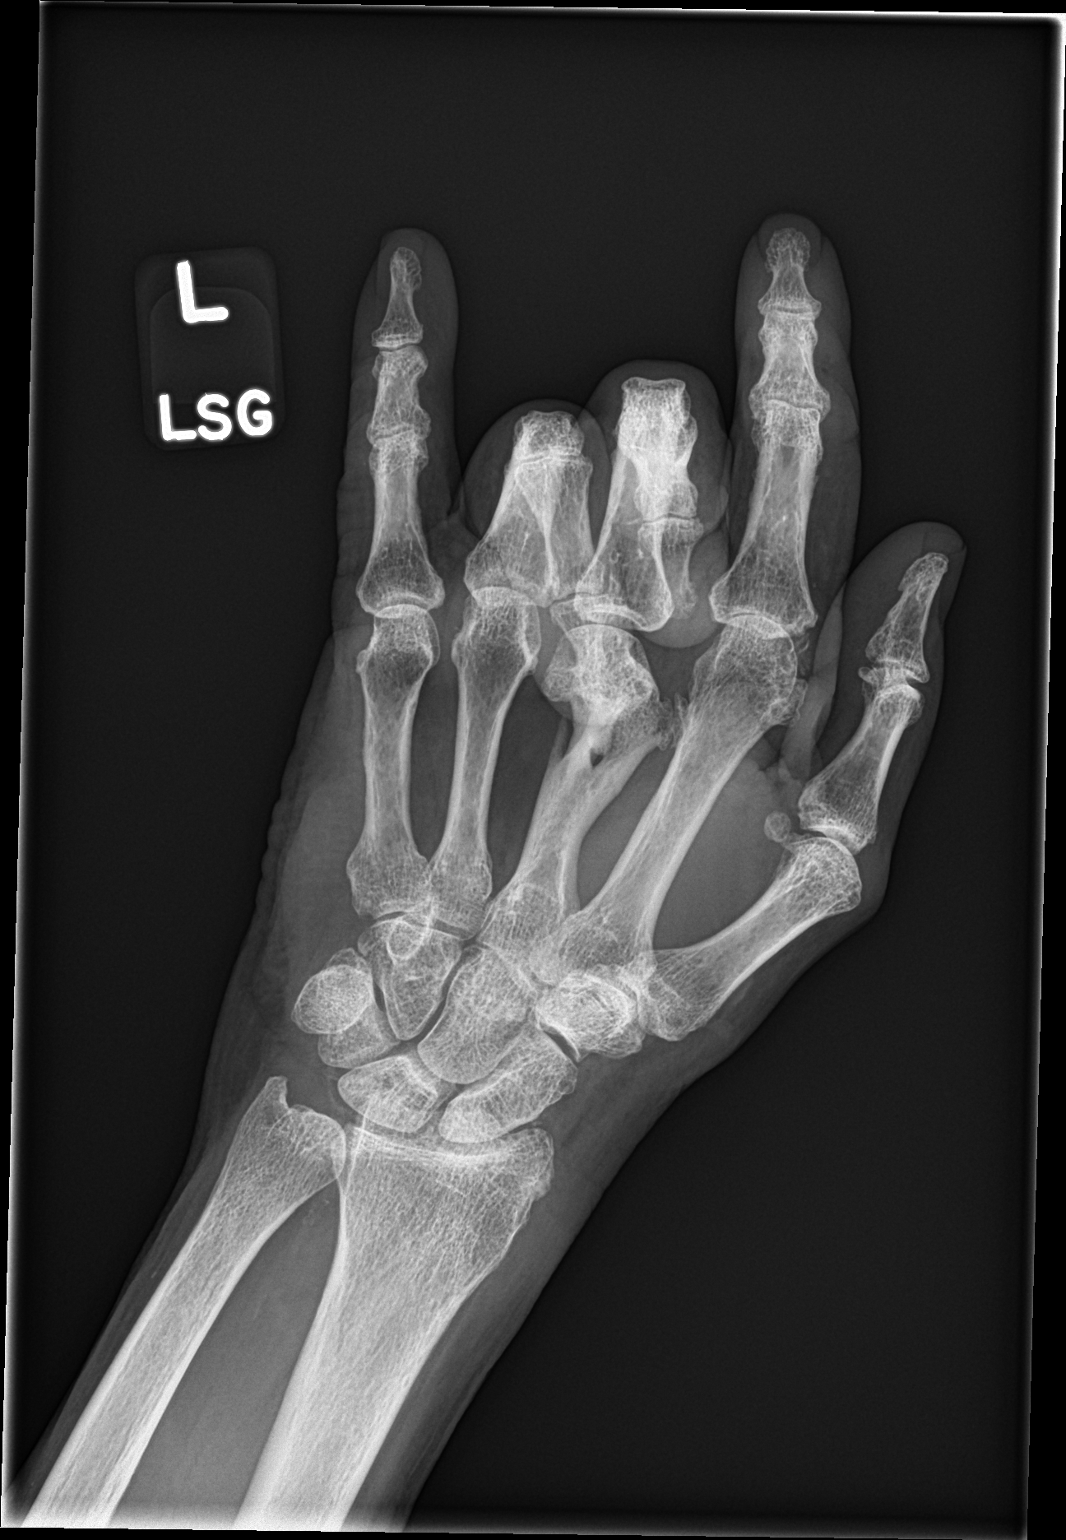

[hand lat]
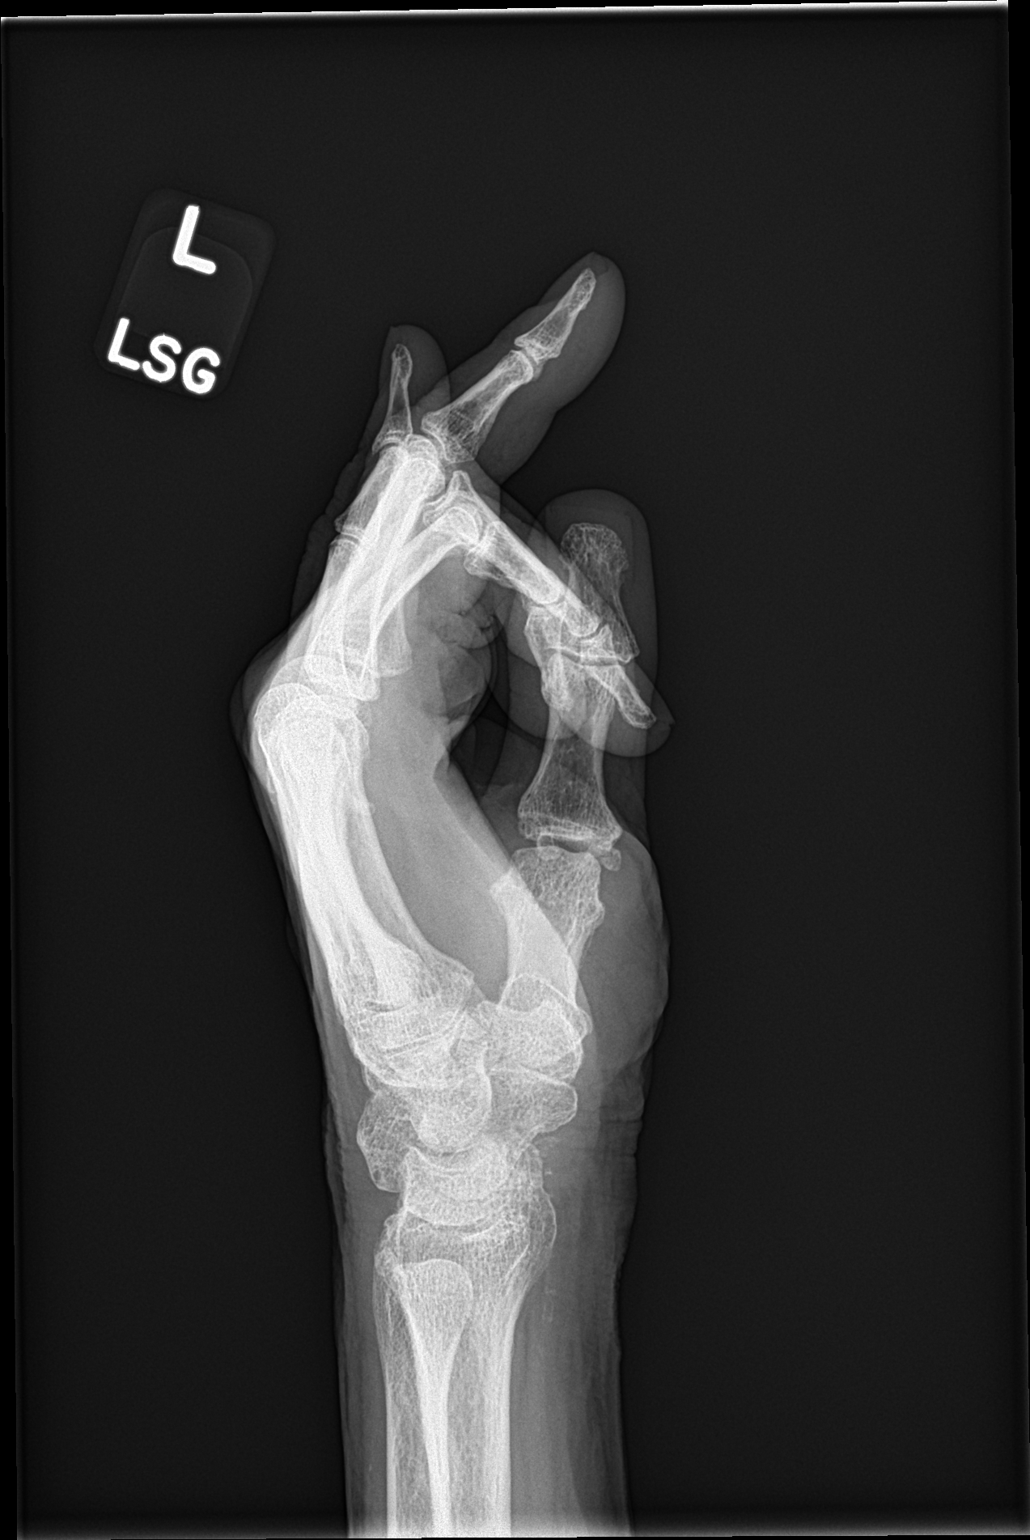

[2 of 2 positions shown; findings below may reference images not displayed]

FINDINGS: Diffuse degenerative change left hand and wrist. Severe deformity
noted of the left third metacarpal, possibly from prior injury.
Superimposed acute fracture cannot be completely excluded. Flexion
deformities noted of the left third and fourth proximal
interphalangeal joints. No prominent periarticular erosions are
noted. Ulnar styloid intact. Diffuse osteopenia. Peripheral vascular
calcification.
IMPRESSION: 1. Severe deformity of the left third metacarpal and flexion
deformities of the left third and fourth proximal interphalangeal
joints as above.

2. Diffuse osteopenia and degenerative change. No prominent
periarticular erosions noted.

3.  Peripheral vascular disease.

## 2019-05-09 DIAGNOSIS — H11002 Unspecified pterygium of left eye: Secondary | ICD-10-CM | POA: Diagnosis not present

## 2019-05-10 DIAGNOSIS — H60391 Other infective otitis externa, right ear: Secondary | ICD-10-CM | POA: Diagnosis not present

## 2019-05-30 DIAGNOSIS — H11812 Pseudopterygium of conjunctiva, left eye: Secondary | ICD-10-CM | POA: Diagnosis not present

## 2019-05-30 DIAGNOSIS — H18463 Peripheral corneal degeneration, bilateral: Secondary | ICD-10-CM | POA: Diagnosis not present

## 2019-06-15 ENCOUNTER — Telehealth: Payer: Self-pay | Admitting: Internal Medicine

## 2019-06-15 NOTE — Progress Notes (Signed)
°  Chronic Care Management   Note  06/15/2019 Name: Jonathan Glass MRN: [id number] DOB: September 27, 1940  Jonathan Glass is a 79 y.o. year old male who is a primary care patient of [name], MD. I reached out to [name] by phone today in response to a referral sent by [name] PCP, [name], MD.   [name] was given information about Chronic Care Management services today including:  1. CCM service includes personalized support from designated clinical staff supervised by his physician, including individualized plan of care and coordination with other care providers 2. 24/7 contact phone numbers for assistance for urgent and routine care needs. 3. Service will only be billed when office clinical staff spend 20 minutes or more in a month to coordinate care. 4. Only one practitioner may furnish and bill the service in a calendar month. 5. The patient may stop CCM services at any time (effective at the end of the month) by phone call to the office staff.   Patient agreed to services and verbal consent obtained.   Follow up plan:   Raynicia Dukes [employer] Scheduler

## 2019-06-19 ENCOUNTER — Other Ambulatory Visit: Payer: Self-pay

## 2019-06-19 ENCOUNTER — Encounter: Payer: Self-pay | Admitting: Podiatry

## 2019-06-19 ENCOUNTER — Ambulatory Visit: Payer: PPO | Admitting: Podiatry

## 2019-06-19 VITALS — Temp 97.3°F

## 2019-06-19 DIAGNOSIS — M792 Neuralgia and neuritis, unspecified: Secondary | ICD-10-CM | POA: Diagnosis not present

## 2019-06-19 MED ORDER — PREGABALIN 75 MG PO CAPS: 75.0000 mg | ORAL_CAPSULE | Freq: Two times a day (BID) | ORAL | 0 refills | Status: DC

## 2019-06-20 ENCOUNTER — Encounter: Payer: Self-pay | Admitting: Podiatry

## 2019-06-20 MED ORDER — NONFORMULARY OR COMPOUNDED ITEM: 1 refills | Status: DC
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
## 2019-06-20 NOTE — Addendum Note (Signed)
Addended by: Graceann Congress D on: 06/20/2019 11:44 AM   Modules accepted: Orders

## 2019-06-29 DIAGNOSIS — H60331 Swimmer's ear, right ear: Secondary | ICD-10-CM | POA: Diagnosis not present

## 2019-06-30 ENCOUNTER — Telehealth: Payer: Self-pay

## 2019-06-30 DIAGNOSIS — I10 Essential (primary) hypertension: Secondary | ICD-10-CM

## 2019-06-30 DIAGNOSIS — J449 Chronic obstructive pulmonary disease, unspecified: Secondary | ICD-10-CM

## 2019-06-30 NOTE — Telephone Encounter (Signed)
Per written referral from PCP, requesting referral in Epic for [name] to chronic care management pharmacy services for the following conditions:   Essential hypertension, benign  [I10]  COPD [J44.9]  Debbora Dus, PharmD Clinical Pharmacist [facility] at [facility] [phone]

## 2019-07-01 NOTE — Telephone Encounter (Signed)
Referral created.

## 2019-07-07 ENCOUNTER — Telehealth: Payer: PPO

## 2019-07-27 DIAGNOSIS — H6241 Otitis externa in other diseases classified elsewhere, right ear: Secondary | ICD-10-CM | POA: Diagnosis not present

## 2019-07-27 DIAGNOSIS — B369 Superficial mycosis, unspecified: Secondary | ICD-10-CM | POA: Diagnosis not present

## 2019-08-03 NOTE — Chronic Care Management (AMB) (Signed)
Chronic Care Management Pharmacy  Name: Jonathan Glass  MRN: [id number] DOB: [birth year]  Chief Complaint/ HPI  Jonathan Glass,  79 y.o., male presents for their Initial CCM visit with the clinical pharmacist via telephone.  PCP : [name], MD   Their chronic conditions include: hypertension, diastolic heart failure, CAD, COPD, asthma, allergic rhinitis, GERD, Meniere's disease, osteoarthritis, chronic renal disease, hyperlipidemia, gout, depression, neuropathic pain   Patient concerns: Pt reports physical therapy from [place] visiting twice a week for the next 4 weeks. States this morning he had drop in BP upon standing per PT BP check. Reports PT is coordinating replacing bathtub for fall prevention, adding ramp in the front of the house due to frequent falls.   Office Visits:  11/08/18: Jonathan Glass - depression f/u - increase in sertraline helped with energy, still grieving wife  09/09/18: Jonathan Glass - AWV, increase sertraline to 100 mg daily, COPD very symptomatic, refer to pulmonology  Consult Visit:  07/27/19: Hearing loss - ear clogged suctioned and irrigated, rtc 3-4 weeks  06/19/19: Neuropathic pain - switch gabapentin to Lyrica 75 mg BID, add compound neuropathic cream BID   12/02/18: Cardiology - SOB - stable atypical chest pain, low dose aspirin, BP controlled on amlodipine, statin intolerant, on ezetimibe; rtc 1 year  11/22/18: Pulmonology - severe asthma/chronic bronchitis, consider biologic agent, check chest xray, restart PPI due to history of GERD/chest pain  Allergies  Allergen Reactions  . Statins Other (See Comments)    REACTION: myalgia with crestor,advicor,lipitor,pravachol  . Lyrica [Pregabalin] Other (See Comments)    Worse swelling Felt bad  . Tramadol Hcl Itching and Rash   Medications: Outpatient Encounter Medications as of 08/04/2019  Medication Sig  . albuterol (PROVENTIL HFA;VENTOLIN HFA) 108 (90 Base) MCG/ACT inhaler Inhale 2 puffs into the lungs  every 6 (six) hours as needed for wheezing or shortness of breath.  . allopurinol (ZYLOPRIM) 300 MG tablet Take 300 mg by mouth daily. 100 mg + 300 mg daily  . amLODipine (NORVASC) 10 MG tablet Take 10 mg by mouth daily.  Marland Kitchen aspirin EC 81 MG tablet Take 81 mg by mouth daily.  . budesonide-formoterol (SYMBICORT) 160-4.5 MCG/ACT inhaler Inhale 2 puffs into the lungs 2 (two) times daily.  . colchicine (COLCRYS) 0.6 MG tablet Take 1 tablet (0.6 mg total) by mouth daily.  . diazepam (VALIUM) 2 MG tablet Take 1 tablet (2 mg total) by mouth 3 (three) times daily as needed for anxiety.  Marland Kitchen ezetimibe (ZETIA) 10 MG tablet Take 5 mg by mouth daily.   . fluticasone (FLONASE) 50 MCG/ACT nasal spray Place 2 sprays into the nose as needed for allergies or rhinitis.   . furosemide (LASIX) 40 MG tablet Take 40 mg by mouth daily.   Marland Kitchen gabapentin (NEURONTIN) 300 MG capsule Take 1-2 capsules (300-600 mg total) by mouth at bedtime. (Patient taking differently: Take 600 mg by mouth 3 (three) times daily. )  . leflunomide (ARAVA) 20 MG tablet   . montelukast (SINGULAIR) 10 MG tablet Take 1 tablet (10 mg total) by mouth at bedtime.  Marland Kitchen omeprazole (PRILOSEC) 20 MG capsule Take 1 capsule (20 mg total) by mouth daily. (Patient taking differently: Take 20 mg by mouth daily. PRN)  . sertraline (ZOLOFT) 100 MG tablet Take 1 tablet (100 mg total) by mouth daily.  Marland Kitchen tiotropium (SPIRIVA HANDIHALER) 18 MCG inhalation capsule Place 1 capsule (18 mcg total) into inhaler and inhale daily.  . NONFORMULARY OR COMPOUNDED ITEM See pharmacy note (  Patient not taking: Reported on 08/04/2019)  . pregabalin (LYRICA) 75 MG capsule Take 1 capsule (75 mg total) by mouth 2 (two) times daily. (Patient not taking: Reported on 08/04/2019)   No facility-administered encounter medications on file as of 08/04/2019.   Current Diagnosis/Assessment:   Emergency planning/management officer Strain: Low Risk   . Difficulty of Paying Living Expenses: Not hard at all   Goals  Addressed            This Visit's Progress   . Pharmacy Care Plan       CARE PLAN ENTRY  Current Barriers:  . Chronic Disease Management support, education, and care coordination needs related to Hypertension, Hyperlipidemia, and Heart Failure   Hypertension . Pharmacist Clinical Goal(s): o Over the next 30 days, patient will work with PharmD and providers to maintain BP goal <140/90 mmHg and above 90/60 mmHg . Current regimen:  o Amlodipine 10 mg - 1 tablet daily . Interventions: o Recommend writing down blood pressure readings from physical therapist twice weekly and begin monitoring blood pressure daily once home monitor is received from [place] . Patient self care activities - Over the next 30 days, patient will: o Check blood pressure daily, document (blood pressure, heart rate, date, time) and provide at future appointment o Ensure daily salt intake < 2300 mg/day  Hyperlipidemia . Pharmacist Clinical Goal(s): o Over the next 30 days, patient will work with PharmD and providers to achieve LDL goal < 70 (most recent LDL: 100) . Current regimen:  o Ezetimibe 10 mg - 1/2 tablet daily  . Interventions: o Recommend heart healthy diet and exercise . Patient self care activities - Over the next 30 days, patient will: o Limit cholesterol content in foods to reduce LDL o Exercise as able, consider chair exercises in addition to physical therapy  Chronic Heart Failure . Pharmacist Clinical Goal(s) o Over the next 30 days, patient will work with PharmD and providers to reduce swelling and improve shortness of breath . Current regimen:  o Furosemide 40 mg - 1 tablet daily  . Interventions: o Recommend daily weight monitoring . Patient self care activities - Over the next 30 days, patient will: o Check weight daily in the morning, take an additional furosemide for weight gain of 3 or more pounds from one morning to the next.  Additional recommendations: . Try lidocaine cream or  patches over the counter (whichever is easier to apply) for nerve pain.    Initial goal documentation      Hypertension   CMP Latest Ref Rng & Units 10/04/2018 12/17/2017 09/10/2017  Glucose 65 - 99 mg/dL 105(H) 121(H) 155(H)  BUN 8 - 27 mg/dL 15 22 20   Creatinine 0.76 - 1.27 mg/dL 1.18 1.33 1.33  Sodium 134 - 144 mmol/L 139 139 139  Potassium 3.5 - 5.2 mmol/L 4.0 4.2 4.3  Chloride 96 - 106 mmol/L 99 104 103  CO2 20 - 29 mmol/L 22 28 28   Calcium 8.6 - 10.2 mg/dL 9.0 9.4 9.4  Total Protein 6.0 - 8.3 g/dL - 6.9 6.7  Total Bilirubin 0.2 - 1.2 mg/dL - 0.7 0.6  Alkaline Phos 39 - 117 U/L - 76 71  AST 0 - 37 U/L - 17 19  ALT 0 - 53 U/L - 15 17   Office blood pressures are: BP Readings from Last 3 Encounters:  12/02/18 116/60  11/22/18 122/70  11/08/18 122/72   Patient has failed these meds in the past: none  Patient checks BP at  home: none, does not have monitor --> [facility] Patient home BP readings are ranging: none reported Pertinent history: PT has first visit today and told patient he had orthostasis; pt reports feeling dizzy upon standing for about month and frequent falls. Reports last fall 4 days ago, some bruising, denies injury. Reports falling right after standing up due to dizziness. He feels this way upon standing regardless of time of day.   BP goal < 140/90 mmHg Patient is currently controlled on the following medications:   Amlodipine 10 mg - 1 tablet daily  We discussed: recommend writing down blood pressure readings from physical therapist twice weekly and begin monitoring blood pressure daily once home monitor is received     Plan: Continue current medications; Begin home blood pressure monitoring. Follow up in 1 month.   Hyperlipidemia/CAD   Lipid Panel     Component Value Date/Time   CHOL 175 08/11/2017 1042   TRIG 143.0 08/11/2017 1042   HDL 45.80 08/11/2017 1042   CHOLHDL 4 08/11/2017 1042   VLDL 28.6 08/11/2017 1042   LDLCALC  100 (H) 08/11/2017 1042     LDL goal < 70 (CAD) Patient has failed these meds in past: statins - pt reports ineffective   Patient is currently uncontrolled on the following medications:   Ezetimibe 10 mg - 1/2 tablet daily  Aspirin 81 mg - 1 tablet daily  We discussed: denies abnormal bruising or bleeding with aspirin, confirms adherence  Pertinent history: patient was on 10 mg zetia until 2014 then switched to 1/2 tablet daily, unclear if patient had adverse effects/reason for dose reduction; pt experienced myopathy with 4+ statins per chart  Plan: Continue current medications; Limit cholesterol content in foods and increase exercise as tolerated.   Heart Failure   Type: Diastolic Last ejection fraction: LVEF normal NYHA Class: III (marked limitation of activity) AHA HF Stage: C (Heart disease and symptoms present)  Patient has failed these meds in past:  Patient is currently controlled on the following medications:   Furosemide 40 mg - 1 tablet daily  We discussed: walks up and back his yard daily - gets very SOB and uses albuterol; still having swelling in legs/ankles, reports raising feet at night to improve  Drinks: 5-6 bottles of water/day, 1 diet coke at 5 PM Salt: does not use table salt Diet: eats at home mostly, daughter cooks   Plan: Continue current medications; Check weight daily in the morning, take an additional furosemide for weight gain of 3 or more pounds from one morning to the next.  COPD / Asthma / Tobacco   Last spirometry score: 05/2008 FEV1 60% Gold Grade: Gold 2 (FEV1 50-79%)  Eosinophil count:   Lab Results  Component Value Date/Time   EOSPCT 1.5 07/20/2017 09:38 AM  %                               Eos (Absolute):  Lab Results  Component Value Date/Time   EOSABS 0.3 10/04/2018 08:44 AM   Tobacco Status:  Social History   Tobacco Use  Smoking Status Former Smoker  . Packs/day: 1.50  . Years: 15.00  . Pack years: 22.50  . Types:  Cigarettes  . Quit date: 03/16/1981  . Years since quitting: 38.4  Smokeless Tobacco Never Used   CPAP: none, no oxygen  Oxygen: checked by PT this morning, reports 'good'  Patient has failed these meds  in past: per pulmonary - avoid antihistamines for allergies due to anuria  Patient is currently controlled on the following medications:   Spiriva 18 mcg - 1 capsule by inhalation daily  Symbicort 160-45 mcg - 2 puffs BID   Ventolin 90 mcg - 2 puffs q6h PRN  Singulair 10 mg - 1 tablet daily at bedtime  Flonase 50 mcg - 1 sprays in each nostril daily  Using maintenance inhaler regularly? Yes Frequency of rescue inhaler use: 3-4x a day (2 puffs each time)  We discussed: pt reports symptoms are stable, confirmed adherence Last pulmonology visit --> 11/22/18 - severe asthma/chronic bronchitis, consider biologic agent, check chest xray, restart PPI due to history of GERD/chest pain  Plan: Continue current medications   Neuropathy   Patient has failed these meds in past: Lyrica - tried for 3-4 weeks recently Patient is currently controlled on the following medications:   Gabapentin 300 mg - 2 capsules TID   Compound cream - did not pick up, too expensive $149  We discussed: Went back to gabapentin since starting Lyrica. He reports Lyrica was ineffective and gabapentin helps some at night. He was unable to afford compounded cream, recommended trial of OTC lidocaine creams or patches.  Plan: Continue current medications; Recommended trial of OTC lidocaine creams or patches (whichever is easier to apply).   Mood   Patient has failed these meds in past: none reported Patient is currently controlled on the following medications:   Sertraline 100 mg - 1 tablet daily  Diazepam 2 mg - 1 tablet TID PRN anxiety  We discussed: pt reports taking 1 diazepam for vertigo and anxiety in the morning; reports doing well on sertraline  Plan: Continue current medications  GERD   Patient has  failed these meds in past: none reported Patient is currently controlled on the following medications:   Omeprazole 20 mg - 1 capsule daily  We discussed: takes as needed  Plan: Continue current medications  Medication Management  Misc: Arava 20 mg - 1 tablet daily (arthritis), Allopurinol 400 mg - 1 tablet daily and colchicine 0.6 mg - 1 tablet daily PRN gout flare --> no recent flares  OTCs: denies vitamins/supplements, denies OTC pain medications   [facility]ef[facility]   Adherence: pillbox - daughter fills it up   Social support: daughter/PT  Affordability: no cost concerns  CCM Follow Up: 09/06/19 at 1:00 PM (telephone) for BP log  Debbora Dus, PharmD Clinical Pharmacist [facility] at [facility] [phone]

## 2019-08-04 ENCOUNTER — Ambulatory Visit: Payer: PPO

## 2019-08-04 ENCOUNTER — Other Ambulatory Visit: Payer: Self-pay

## 2019-08-04 DIAGNOSIS — E785 Hyperlipidemia, unspecified: Secondary | ICD-10-CM

## 2019-08-04 DIAGNOSIS — J439 Emphysema, unspecified: Secondary | ICD-10-CM

## 2019-08-04 DIAGNOSIS — I10 Essential (primary) hypertension: Secondary | ICD-10-CM

## 2019-08-11 NOTE — Patient Instructions (Signed)
Aug 11, 2019  Dear Jonathan Glass,  It was a pleasure meeting you during our initial appointment on Aug 04, 2019. Below is a summary of the goals we discussed and components of chronic care management. Please contact me anytime with questions or concerns.   Visit Information  Goals Addressed            This Visit's Progress   . Pharmacy Care Plan       CARE PLAN ENTRY  Current Barriers:  . Chronic Disease Management support, education, and care coordination needs related to Hypertension, Hyperlipidemia, and Heart Failure   Hypertension . Pharmacist Clinical Goal(s): o Over the next 30 days, patient will work with PharmD and providers to maintain BP goal <140/90 mmHg and above 90/60 mmHg . Current regimen:  o Amlodipine 10 mg - 1 tablet daily . Interventions: o Recommend writing down blood pressure readings from physical therapist twice weekly and begin monitoring blood pressure daily once home monitor is received from [place] . Patient self care activities - Over the next 30 days, patient will: o Check blood pressure daily, document (blood pressure, heart rate, date, time) and provide at future appointment o Ensure daily salt intake < 2300 mg/day  Hyperlipidemia . Pharmacist Clinical Goal(s): o Over the next 30 days, patient will work with PharmD and providers to achieve LDL goal < 70 (most recent LDL: 100) . Current regimen:  o Ezetimibe 10 mg - 1/2 tablet daily  . Interventions: o Recommend heart healthy diet and exercise . Patient self care activities - Over the next 30 days, patient will: o Limit cholesterol content in foods to reduce LDL o Exercise as able, consider chair exercises in addition to physical therapy  Chronic Heart Failure . Pharmacist Clinical Goal(s) o Over the next 30 days, patient will work with PharmD and providers to reduce swelling and improve shortness of breath . Current regimen:  o Furosemide 40 mg - 1 tablet daily   . Interventions: o Recommend daily weight monitoring . Patient self care activities - Over the next 30 days, patient will: o Check weight daily in the morning, take an additional furosemide for weight gain of 3 or more pounds from one morning to the next.  Additional recommendations: . Try lidocaine cream or patches over the counter (whichever is easier to apply) for nerve pain.    Initial goal documentation       Jonathan Glass was given information about Chronic Care Management services today including:  1. CCM service includes personalized support from designated clinical staff supervised by his physician, including individualized plan of care and coordination with other care providers 2. 24/7 contact phone numbers for assistance for urgent and routine care needs. 3. Standard insurance, coinsurance, copays and deductibles apply for chronic care management only during months in which we provide at least 20 minutes of these services. Most insurances cover these services at 100%, however patients may be responsible for any copay, coinsurance and/or deductible if applicable. This service may help you avoid the need for more expensive face-to-face services. 4. Only one practitioner may furnish and bill the service in a calendar month. 5. The patient may stop CCM services at any time (effective at the end of the month) by phone call to the office staff.  Patient agreed to services and verbal consent obtained.   The patient verbalized understanding of instructions provided today and agreed to receive a mailed copy of patient instruction and/or educational materials. Telephone follow up appointment with  pharmacy team member scheduled for: 09/06/19 at 1:00 PM (telephone) for blood pressure log  Debbora Dus, PharmD Clinical Pharmacist [facility] at [facility] [phone]  Cholesterol Content in Foods Cholesterol is a waxy, fat-like substance that helps to carry fat in the blood.  The body needs cholesterol in small amounts, but too much cholesterol can cause damage to the arteries and heart. Most people should eat less than 200 milligrams (mg) of cholesterol a day. Foods with cholesterol  Cholesterol is found in animal-based foods, such as meat, seafood, and dairy. Generally, low-fat dairy and lean meats have less cholesterol than full-fat dairy and fatty meats. The milligrams of cholesterol per serving (mg per serving) of common cholesterol-containing foods are listed below. Meat and other proteins  Egg -- one large whole egg has 186 mg.  Veal shank -- 4 oz has 141 mg.  Lean ground [place] (93% lean) -- 4 oz has 118 mg.  Fat-trimmed lamb loin -- 4 oz has 106 mg.  Lean ground beef (90% lean) -- 4 oz has 100 mg.  Lobster -- 3.5 oz has 90 mg.  Pork loin chops -- 4 oz has 86 mg.  Canned salmon -- 3.5 oz has 83 mg.  Fat-trimmed beef top loin -- 4 oz has 78 mg.  Frankfurter -- 1 frank (3.5 oz) has 77 mg.  Crab -- 3.5 oz has 71 mg.  Roasted chicken without skin, white meat -- 4 oz has 66 mg.  Light bologna -- 2 oz has 45 mg.  Deli-cut [place] -- 2 oz has 31 mg.  Canned tuna -- 3.5 oz has 31 mg.  Berniece Salines -- 1 oz has 29 mg.  Oysters and mussels (raw) -- 3.5 oz has 25 mg.  Mackerel -- 1 oz has 22 mg.  Trout -- 1 oz has 20 mg.  Pork sausage -- 1 link (1 oz) has 17 mg.  Salmon -- 1 oz has 16 mg.  Tilapia -- 1 oz has 14 mg. Dairy  Soft-serve ice cream --  cup (4 oz) has 103 mg.  Whole-milk yogurt -- 1 cup (8 oz) has 29 mg.  Cheddar cheese -- 1 oz has 28 mg.  American cheese -- 1 oz has 28 mg.  Whole milk -- 1 cup (8 oz) has 23 mg.  2% milk -- 1 cup (8 oz) has 18 mg.  Cream cheese -- 1 tablespoon (Tbsp) has 15 mg.  Cottage cheese --  cup (4 oz) has 14 mg.  Low-fat (1%) milk -- 1 cup (8 oz) has 10 mg.  Sour cream -- 1 Tbsp has 8.5 mg.  Low-fat yogurt -- 1 cup (8 oz) has 8 mg.  Nonfat Greek yogurt -- 1 cup (8 oz) has 7  mg.  Half-and-half cream -- 1 Tbsp has 5 mg. Fats and oils  Cod liver oil -- 1 tablespoon (Tbsp) has 82 mg.  Butter -- 1 Tbsp has 15 mg.  Lard -- 1 Tbsp has 14 mg.  Bacon grease -- 1 Tbsp has 14 mg.  Mayonnaise -- 1 Tbsp has 5-10 mg.  Margarine -- 1 Tbsp has 3-10 mg. Exact amounts of cholesterol in these foods may vary depending on specific ingredients and brands. Foods without cholesterol Most plant-based foods do not have cholesterol unless you combine them with a food that has cholesterol. Foods without cholesterol include:  Grains and cereals.  Vegetables.  Fruits.  Vegetable oils, such as olive, canola, and sunflower oil.  Legumes, such as peas, beans, and lentils.  Nuts and seeds.  Egg whites. Summary  The body needs cholesterol in small amounts, but too much cholesterol can cause damage to the arteries and heart.  Most people should eat less than 200 milligrams (mg) of cholesterol a day. This information is not intended to replace advice given to you by your health care provider. Make sure you discuss any questions you have with your health care provider. Document Revised: 02/12/2017 Document Reviewed: 10/27/2016 Elsevier Patient Education  [place].

## 2019-08-18 ENCOUNTER — Telehealth: Payer: Self-pay | Admitting: Internal Medicine

## 2019-08-18 DIAGNOSIS — R42 Dizziness and giddiness: Secondary | ICD-10-CM

## 2019-08-18 NOTE — Telephone Encounter (Signed)
Patient called today. He stated that he is needing a referral to be seen by an ENT for his vertigo. He stated he [facility] with [facility] in [place] and they are requesting the referral before they schedule     [facility] [place] [facility] [place] [facility] [employer]  [address]  [place], [place] [address]

## 2019-08-29 DIAGNOSIS — B369 Superficial mycosis, unspecified: Secondary | ICD-10-CM | POA: Diagnosis not present

## 2019-08-29 DIAGNOSIS — H6240 Otitis externa in other diseases classified elsewhere, unspecified ear: Secondary | ICD-10-CM | POA: Diagnosis not present

## 2019-08-29 DIAGNOSIS — R42 Dizziness and giddiness: Secondary | ICD-10-CM | POA: Diagnosis not present

## 2019-08-29 DIAGNOSIS — H90A31 Mixed conductive and sensorineural hearing loss, unilateral, right ear with restricted hearing on the contralateral side: Secondary | ICD-10-CM | POA: Diagnosis not present

## 2019-09-06 ENCOUNTER — Telehealth: Payer: PPO

## 2019-09-08 DIAGNOSIS — H8101 Meniere's disease, right ear: Secondary | ICD-10-CM | POA: Diagnosis not present

## 2019-09-08 DIAGNOSIS — H903 Sensorineural hearing loss, bilateral: Secondary | ICD-10-CM | POA: Diagnosis not present

## 2019-09-11 ENCOUNTER — Telehealth: Payer: Self-pay | Admitting: Internal Medicine

## 2019-09-11 NOTE — Telephone Encounter (Signed)
[facility] - Client TELEPHONE ADVICE RECORD [facility] Patient Name: Jonathan Glass Gender: Male DOB: 1940/11/22 Age: 79 Y 25 M 15 D Return Phone Number: [phone] (Primary) Address: City/State/Zip: [place] [address] Client [facility] - Client Client Site [place] - Day Physician Viviana Simpler - MD Contact Type Call Who Is Calling Patient / Member / Family / Caregiver Call Type Triage / Clinical Relationship To Patient Self Return Phone Number [phone] (Primary) Chief Complaint Abdominal Pain Reason for Call Symptomatic / Request for [place] states he is having abdominal pain with burning, diarrhea for 3 days. Translation No Nurse Assessment Guidelines Guideline Title Affirmed Question Affirmed Notes Nurse Date/Time (Eastern Time) Disp. Time Eilene Ghazi Time) Disposition Final User 09/11/2019 8:58:22 AM Attempt made - message left Raphael Gibney, RN, Vanita Ingles 09/11/2019 9:07:44 AM Attempt made - message left Raphael Gibney, RN, Vanita Ingles 09/11/2019 9:16:28 AM FINAL ATTEMPT MADE - message left

## 2019-09-11 NOTE — Telephone Encounter (Signed)
Because of his symptoms, Bridgette was advised to send him to triage. Also, those symptoms usually keep them from coming in the office due to Covid. When Bridgette went back to the call, he had hung up. There were no spots today for a 30 min Virtual Visit. There is a same day with an open spot tomorrow. Please advise.

## 2019-09-11 NOTE — Telephone Encounter (Signed)
Pt reports diarrhea stopped this weekend and he denies any other symptoms. Pt did get both covid vaccines. Advised apt for tomorrow will be changed to in office apt but if any symptoms changed to contact office. Pt verbalized understanding.   Documented vaccines

## 2019-09-11 NOTE — Telephone Encounter (Signed)
Pt called in said he just don't feel well. He has diarrhea, had a fever on Friday but not today, and also burning pain in his stomach. I tried to transfer patient to access nurse to be triaged but he hung up. Access Nurse said they will reach out to him.

## 2019-09-11 NOTE — Telephone Encounter (Signed)
I would be okay with seeing him in the office today at 2 if allowed to see him here with those symptoms (the abdominal pain is different than diarrhea alone or with respiratory symptoms).can also check to see if he has had COVID vaccine

## 2019-09-11 NOTE — Telephone Encounter (Signed)
It would be better to see him in person if possible. See the [place] message as well I have a 2PM open today still (or can switch tomorrow to in person if appropriate)

## 2019-09-11 NOTE — Telephone Encounter (Signed)
I spoke with pt; see pt message for today as well. I spoke with pt and he said he had burning lt side pain for 5-6 days. When I asked if pt was on and off or continuous with pain pt said he does not feel like answering a lot of questions and wants to know if can get appt with Dr Silvio Pate or not; pt said tomorrow was OK for appt. I tried to explain why I was asking questions but pt did not want to hear that and just wanted to know if I was going to give pt appt or not. With information I presently have pt scheduled my chart visit on 09/12/19 at 11:15. Pt was appreciative and UC & ED precautions given and pt voiced understanding. FYI to Dr Silvio Pate and Larene Beach CMA.

## 2019-09-12 ENCOUNTER — Ambulatory Visit (INDEPENDENT_AMBULATORY_CARE_PROVIDER_SITE_OTHER): Payer: PPO | Admitting: Internal Medicine

## 2019-09-12 ENCOUNTER — Other Ambulatory Visit: Payer: Self-pay

## 2019-09-12 ENCOUNTER — Telehealth: Payer: PPO | Admitting: Internal Medicine

## 2019-09-12 ENCOUNTER — Encounter: Payer: Self-pay | Admitting: Internal Medicine

## 2019-09-12 VITALS — BP 122/72 | HR 71 | Temp 97.7°F | Ht 71.0 in | Wt 182.0 lb

## 2019-09-12 DIAGNOSIS — R109 Unspecified abdominal pain: Secondary | ICD-10-CM | POA: Diagnosis not present

## 2019-09-12 DIAGNOSIS — M792 Neuralgia and neuritis, unspecified: Secondary | ICD-10-CM | POA: Diagnosis not present

## 2019-09-12 DIAGNOSIS — F324 Major depressive disorder, single episode, in partial remission: Secondary | ICD-10-CM

## 2019-09-12 LAB — COMPREHENSIVE METABOLIC PANEL
ALT: 10 U/L (ref 0–53)
AST: 17 U/L (ref 0–37)
Albumin: 3.9 g/dL (ref 3.5–5.2)
Alkaline Phosphatase: 84 U/L (ref 39–117)
BUN: 16 mg/dL (ref 6–23)
CO2: 29 mEq/L (ref 19–32)
Calcium: 9.3 mg/dL (ref 8.4–10.5)
Chloride: 101 mEq/L (ref 96–112)
Creatinine, Ser: 1.3 mg/dL (ref 0.40–1.50)
GFR: 53.07 mL/min — ABNORMAL LOW (ref >60.00)
Glucose, Bld: 103 mg/dL — ABNORMAL HIGH (ref 70–99)
Potassium: 4.1 mEq/L (ref 3.5–5.1)
Sodium: 136 mEq/L (ref 135–145)
Total Bilirubin: 0.7 mg/dL (ref 0.2–1.2)
Total Protein: 7 g/dL (ref 6.0–8.3)

## 2019-09-12 LAB — LIPASE: Lipase: 9 U/L — ABNORMAL LOW (ref 11.0–59.0)

## 2019-09-12 LAB — CBC
HCT: 39.9 % (ref 39.0–52.0)
Hemoglobin: 13.5 g/dL (ref 13.0–17.0)
MCHC: 33.8 g/dL (ref 30.0–36.0)
MCV: 86 fl (ref 78.0–100.0)
Platelets: 182 10*3/uL (ref 150.0–400.0)
RBC: 4.63 Mil/uL (ref 4.22–5.81)
RDW: 16.1 % — ABNORMAL HIGH (ref 11.5–15.5)
WBC: 9.2 10*3/uL (ref 4.0–10.5)

## 2019-09-12 LAB — SEDIMENTATION RATE: Sed Rate: 37 mm/hr — ABNORMAL HIGH (ref 0–20)

## 2019-09-12 NOTE — Assessment & Plan Note (Signed)
Ongoing depression but not worse Might consider change to duloxetine considering it may also help neuropathy
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
## 2019-09-12 NOTE — Assessment & Plan Note (Signed)
Gabapentin gives some relief Will consider adding duloxetine as well--especially if no internal cause found for his abdominal pain

## 2019-09-12 NOTE — Patient Instructions (Signed)
Please try 1-2 cans of boost or ensure daily to see if you can gain back some weight.

## 2019-09-12 NOTE — Assessment & Plan Note (Addendum)
The burning sensation is suggestive of neuropathy also--but not clearly dermatomal Has decreased appetite and some weight loss Will check labs Check CT abd/pelvis May need to consider GI reevaluation

## 2019-09-26 ENCOUNTER — Other Ambulatory Visit: Payer: Self-pay

## 2019-09-26 ENCOUNTER — Ambulatory Visit
Admission: RE | Admit: 2019-09-26 | Discharge: 2019-09-26 | Disposition: A | Discharge status: Home / Self Care | Payer: PPO | Source: Ambulatory Visit | Attending: Internal Medicine | Admitting: Internal Medicine

## 2019-09-26 DIAGNOSIS — R109 Unspecified abdominal pain: Secondary | ICD-10-CM

## 2019-09-26 DIAGNOSIS — R102 Pelvic and perineal pain: Secondary | ICD-10-CM | POA: Diagnosis not present

## 2019-09-26 DIAGNOSIS — R634 Abnormal weight loss: Secondary | ICD-10-CM | POA: Diagnosis not present

## 2019-09-26 DIAGNOSIS — I700 Atherosclerosis of aorta: Secondary | ICD-10-CM | POA: Diagnosis not present

## 2019-09-26 DIAGNOSIS — K5730 Diverticulosis of large intestine without perforation or abscess without bleeding: Secondary | ICD-10-CM | POA: Diagnosis not present

## 2019-09-26 MED ORDER — IOPAMIDOL (ISOVUE-300) INJECTION 61%
100.0000 mL | Freq: Once | INTRAVENOUS | Status: AC | PRN
  Administered 2019-09-26: 100 mL via INTRAVENOUS

## 2019-10-19 ENCOUNTER — Ambulatory Visit: Payer: PPO | Admitting: Podiatry

## 2019-10-19 DIAGNOSIS — H60331 Swimmer's ear, right ear: Secondary | ICD-10-CM | POA: Diagnosis not present

## 2019-11-10 ENCOUNTER — Ambulatory Visit (INDEPENDENT_AMBULATORY_CARE_PROVIDER_SITE_OTHER): Payer: PPO | Admitting: Internal Medicine

## 2019-11-10 ENCOUNTER — Other Ambulatory Visit: Payer: Self-pay

## 2019-11-10 ENCOUNTER — Encounter: Payer: Self-pay | Admitting: Internal Medicine

## 2019-11-10 VITALS — BP 138/80 | HR 93 | Temp 98.4°F | Ht 70.0 in | Wt 177.0 lb

## 2019-11-10 DIAGNOSIS — M0579 Rheumatoid arthritis with rheumatoid factor of multiple sites without organ or systems involvement: Secondary | ICD-10-CM

## 2019-11-10 DIAGNOSIS — F324 Major depressive disorder, single episode, in partial remission: Secondary | ICD-10-CM

## 2019-11-10 DIAGNOSIS — I25119 Atherosclerotic heart disease of native coronary artery with unspecified angina pectoris: Secondary | ICD-10-CM | POA: Diagnosis not present

## 2019-11-10 DIAGNOSIS — I700 Atherosclerosis of aorta: Secondary | ICD-10-CM | POA: Insufficient documentation

## 2019-11-10 DIAGNOSIS — M792 Neuralgia and neuritis, unspecified: Secondary | ICD-10-CM

## 2019-11-10 DIAGNOSIS — Z0000 Encounter for general adult medical examination without abnormal findings: Secondary | ICD-10-CM

## 2019-11-10 DIAGNOSIS — Z7189 Other specified counseling: Secondary | ICD-10-CM | POA: Diagnosis not present

## 2019-11-10 DIAGNOSIS — I5032 Chronic diastolic (congestive) heart failure: Secondary | ICD-10-CM | POA: Diagnosis not present

## 2019-11-10 DIAGNOSIS — J449 Chronic obstructive pulmonary disease, unspecified: Secondary | ICD-10-CM

## 2019-11-10 DIAGNOSIS — E441 Mild protein-calorie malnutrition: Secondary | ICD-10-CM | POA: Diagnosis not present
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
## 2019-11-10 NOTE — Assessment & Plan Note (Signed)
Has DNR 

## 2019-11-10 NOTE — Assessment & Plan Note (Signed)
Stable DOE on budesonide/formoterol and tiotropium

## 2019-11-10 NOTE — Assessment & Plan Note (Signed)
Significant weight loss and some wasting Asked him to monitor weight Discussed high calorie density foods

## 2019-11-10 NOTE — Assessment & Plan Note (Signed)
On imaging Hasn't tolerated statins Is on zetia

## 2019-11-10 NOTE — Assessment & Plan Note (Signed)
DOE likely also pulmonary Is on zetia and ASA

## 2019-11-10 NOTE — Assessment & Plan Note (Signed)
I have personally reviewed the Medicare Annual Wellness questionnaire and have noted 1. The patient's medical and social history 2. Their use of alcohol, tobacco or illicit drugs 3. Their current medications and supplements 4. The patient's functional ability including ADL's, fall risks, home safety risks and hearing or visual             impairment. 5. Diet and physical activities 6. Evidence for depression or mood disorders  The patients weight, height, BMI and visual acuity have been recorded in the chart I have made referrals, counseling and provided education to the patient based review of the above and I have provided the pt with a written personalized care plan for preventive services.  I have provided you with a copy of your personalized plan for preventive services. Please take the time to review along with your updated medication list.  No cancer screening due to age Discussed exercises--like resistance (chair exercises) Flu vaccine and COVID booster

## 2019-11-10 NOTE — Assessment & Plan Note (Signed)
Fairly controlled on the arava

## 2019-11-10 NOTE — Assessment & Plan Note (Signed)
Severe and disabling On the lyrica

## 2019-11-10 NOTE — Assessment & Plan Note (Signed)
Chronic and also has anxiety Fair control on sertraline Discussed occasional diazepam if anxiety bad

## 2019-11-10 NOTE — Assessment & Plan Note (Signed)
Compensated on furosemide

## 2019-11-16 DIAGNOSIS — H60331 Swimmer's ear, right ear: Secondary | ICD-10-CM | POA: Diagnosis not present

## 2019-11-23 ENCOUNTER — Ambulatory Visit (INDEPENDENT_AMBULATORY_CARE_PROVIDER_SITE_OTHER): Payer: PPO

## 2019-11-23 ENCOUNTER — Other Ambulatory Visit: Payer: Self-pay

## 2019-11-23 DIAGNOSIS — Z23 Encounter for immunization: Secondary | ICD-10-CM

## 2019-12-07 DIAGNOSIS — Z8669 Personal history of other diseases of the nervous system and sense organs: Secondary | ICD-10-CM | POA: Diagnosis not present

## 2019-12-07 DIAGNOSIS — H938X1 Other specified disorders of right ear: Secondary | ICD-10-CM | POA: Diagnosis not present

## 2019-12-11 DIAGNOSIS — H11812 Pseudopterygium of conjunctiva, left eye: Secondary | ICD-10-CM | POA: Diagnosis not present

## 2020-01-05 ENCOUNTER — Encounter: Payer: Self-pay | Admitting: Cardiovascular Disease

## 2020-01-05 ENCOUNTER — Ambulatory Visit: Payer: PPO | Admitting: Cardiovascular Disease

## 2020-01-05 ENCOUNTER — Other Ambulatory Visit: Payer: Self-pay

## 2020-01-05 VITALS — BP 124/58 | HR 71 | Ht 71.0 in | Wt 180.6 lb

## 2020-01-05 DIAGNOSIS — E782 Mixed hyperlipidemia: Secondary | ICD-10-CM

## 2020-01-05 DIAGNOSIS — I25118 Atherosclerotic heart disease of native coronary artery with other forms of angina pectoris: Secondary | ICD-10-CM

## 2020-01-05 DIAGNOSIS — I10 Essential (primary) hypertension: Secondary | ICD-10-CM

## 2020-01-05 NOTE — Patient Instructions (Signed)
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
## 2020-01-11 DIAGNOSIS — H938X3 Other specified disorders of ear, bilateral: Secondary | ICD-10-CM | POA: Diagnosis not present

## 2020-02-01 DIAGNOSIS — H938X2 Other specified disorders of left ear: Secondary | ICD-10-CM | POA: Diagnosis not present

## 2020-02-01 DIAGNOSIS — Z8669 Personal history of other diseases of the nervous system and sense organs: Secondary | ICD-10-CM | POA: Diagnosis not present

## 2020-02-06 DIAGNOSIS — H11812 Pseudopterygium of conjunctiva, left eye: Secondary | ICD-10-CM | POA: Diagnosis not present

## 2020-02-28 DIAGNOSIS — H6121 Impacted cerumen, right ear: Secondary | ICD-10-CM | POA: Diagnosis not present

## 2020-03-28 DIAGNOSIS — H6061 Unspecified chronic otitis externa, right ear: Secondary | ICD-10-CM | POA: Diagnosis not present

## 2020-03-28 DIAGNOSIS — H938X2 Other specified disorders of left ear: Secondary | ICD-10-CM | POA: Diagnosis not present

## 2020-04-10 ENCOUNTER — Telehealth: Payer: Self-pay | Admitting: *Deleted

## 2020-04-10 ENCOUNTER — Ambulatory Visit: Payer: PPO | Admitting: Internal Medicine

## 2020-04-10 NOTE — Telephone Encounter (Signed)
That sounds good Can't do earlier than 2 tomorrow ??virtual (I am okay with office if allowed)

## 2020-04-10 NOTE — Telephone Encounter (Addendum)
We have 2pm and 4pm Same Day tomorrow. Should I offer him one of those?

## 2020-04-10 NOTE — Telephone Encounter (Signed)
Pt calling requesting OV Pt c/o sinus congestion. All mucous is clear.  Denied sore throat, PND, cough, wheeze, SOB Denies fever.   Pharmacy [facility]  Will send to Dr Silvio Pate to see if he would like a virtual visit or see the patient in office. OP requesting today or tomorrow.

## 2020-04-10 NOTE — Telephone Encounter (Signed)
Spoke to pt. He is coming at 2pm. He will call the [facility] phone when he gets here. I will bring him in through the EMS door.

## 2020-04-11 ENCOUNTER — Ambulatory Visit (INDEPENDENT_AMBULATORY_CARE_PROVIDER_SITE_OTHER): Payer: PPO | Admitting: Internal Medicine

## 2020-04-11 ENCOUNTER — Other Ambulatory Visit: Payer: Self-pay

## 2020-04-11 ENCOUNTER — Encounter: Payer: Self-pay | Admitting: Internal Medicine

## 2020-04-11 DIAGNOSIS — J019 Acute sinusitis, unspecified: Secondary | ICD-10-CM | POA: Insufficient documentation

## 2020-04-11 DIAGNOSIS — J0140 Acute pansinusitis, unspecified: Secondary | ICD-10-CM | POA: Diagnosis not present

## 2020-04-11 MED ORDER — PREDNISONE 20 MG PO TABS
40.0000 mg | ORAL_TABLET | Freq: Every day | ORAL | 0 refills | Status: DC
Start: 2020-04-11 — End: 2020-05-29

## 2020-04-11 MED ORDER — AMOXICILLIN 500 MG PO TABS
1000.0000 mg | ORAL_TABLET | Freq: Two times a day (BID) | ORAL | 0 refills | Status: AC
Start: 2020-04-11 — End: 2020-04-18
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
## 2020-04-22 MED ORDER — AMOXICILLIN-POT CLAVULANATE 875-125 MG PO TABS: 1.0000 | ORAL_TABLET | Freq: Two times a day (BID) | ORAL | 0 refills | Status: DC

## 2020-05-06 DIAGNOSIS — H60331 Swimmer's ear, right ear: Secondary | ICD-10-CM | POA: Diagnosis not present

## 2020-05-08 ENCOUNTER — Telehealth: Payer: Self-pay

## 2020-05-08 NOTE — Chronic Care Management (AMB) (Addendum)
Chronic Care Management Pharmacy Assistant   Name: Jonathan Glass  MRN: [id number] DOB: 06-19-1940  Reason for Encounter: Disease State- BP log   Recent office visits: 04/11/2020  Dr.Letvak PCP 03/28/2020 Joesph Fillers PA-C ENT    PCP : [name], MD  Allergies:   Allergies  Allergen Reactions   Statins Other (See Comments)    REACTION: myalgia with crestor,advicor,lipitor,pravachol   Lyrica [Pregabalin] Other (See Comments)    Worse swelling Felt bad   Hydrocodone-Acetaminophen Itching   Tramadol Hcl Itching and Rash    Medications: Outpatient Encounter Medications as of 05/08/2020  Medication Sig   albuterol (PROVENTIL HFA;VENTOLIN HFA) 108 (90 Base) MCG/ACT inhaler Inhale 2 puffs into the lungs every 6 (six) hours as needed for wheezing or shortness of breath.   allopurinol (ZYLOPRIM) 300 MG tablet Take 300 mg by mouth daily. 100 mg + 300 mg daily   amLODipine (NORVASC) 10 MG tablet Take 10 mg by mouth daily.   amoxicillin-clavulanate (AUGMENTIN) 875-125 MG tablet Take 1 tablet by mouth 2 (two) times daily.   aspirin EC 81 MG tablet Take 81 mg by mouth daily.   budesonide-formoterol (SYMBICORT) 160-4.5 MCG/ACT inhaler Inhale 2 puffs into the lungs 2 (two) times daily.   colchicine (COLCRYS) 0.6 MG tablet Take 1 tablet (0.6 mg total) by mouth daily.   diazepam (VALIUM) 2 MG tablet Take 1 tablet (2 mg total) by mouth 3 (three) times daily as needed for anxiety.   ezetimibe (ZETIA) 10 MG tablet Take 5 mg by mouth daily.   fluticasone (FLONASE) 50 MCG/ACT nasal spray Place 2 sprays into the nose as needed for allergies or rhinitis.   furosemide (LASIX) 40 MG tablet Take 40 mg by mouth daily.   gabapentin (NEURONTIN) 300 MG capsule Take 600 mg by mouth 3 (three) times daily.   leflunomide (ARAVA) 20 MG tablet    montelukast (SINGULAIR) 10 MG tablet Take 1 tablet (10 mg total) by mouth at bedtime.   omeprazole (PRILOSEC) 20 MG capsule Take 1 capsule (20 mg total)  by mouth daily.   predniSONE (DELTASONE) 20 MG tablet Take 2 tablets (40 mg total) by mouth daily.   pregabalin (LYRICA) 75 MG capsule Take 1 capsule (75 mg total) by mouth 2 (two) times daily.   sertraline (ZOLOFT) 100 MG tablet Take 1 tablet (100 mg total) by mouth daily.   tiotropium (SPIRIVA HANDIHALER) 18 MCG inhalation capsule Place 1 capsule (18 mcg total) into inhaler and inhale daily.   No facility-administered encounter medications on file as of 05/08/2020.    Current Diagnosis: Patient Active Problem List   Diagnosis Date Noted   Acute non-recurrent sinusitis 04/11/2020   Aortic atherosclerosis ([place]) 11/10/2019   Malnutrition of mild degree ([place]) 11/10/2019   Left sided abdominal pain 09/12/2019   Chronic renal disease, stage III ([place]) 09/09/2018   COPD (chronic obstructive pulmonary disease) ([place]) 08/11/2017   Fundic gland polyposis of stomach 08/04/2017   Primary osteoarthritis, right shoulder 05/03/2017   Asthma exacerbation, mild 04/29/2016   Chronic otitis externa of both ears 12/17/2015   Bilateral impacted cerumen 10/28/2015   Chronic diastolic heart failure ([place]) 09/13/2015   Chronic mycotic otitis externa 07/09/2015   Mixed conductive and sensorineural hearing loss of both ears 04/30/2015   Constipation 11/23/2014   Advance directive discussed with patient 09/11/2014   Major depressive disorder, single episode, in partial remission ([place]) 02/02/2014   Vertigo 08/25/2013   Peripheral neuropathic pain 02/29/2012   Routine general  medical examination at a health care facility 05/15/2011   MENIERE'S DISEASE 02/18/2010   COLONIC POLYPS, ADENOMATOUS, HX OF 01/03/2009   ALLERGIC RHINITIS 06/01/2008   Arthritis, rheumatoid ([place]) 12/14/2006   Hyperlipemia 09/01/2006   GOUT 09/01/2006   Essential hypertension, benign 09/01/2006   Atherosclerotic heart disease of native coronary artery with angina pectoris ([place]) 09/01/2006   GERD 09/01/2006   OSTEOARTHRITIS 09/01/2006    Reviewed chart prior to disease state call. Spoke with patient regarding BP  Recent Office Vitals: BP Readings from Last 3 Encounters:  04/11/20 130/70  01/05/20 (!) 124/58  11/10/19 138/80   Pulse Readings from Last 3 Encounters:  04/11/20 65  01/05/20 71  11/10/19 93    Wt Readings from Last 3 Encounters:  04/11/20 178 lb (80.7 kg)  01/05/20 180 lb 9.6 oz (81.9 kg)  11/10/19 177 lb (80.3 kg)     Kidney Function Lab Results  Component Value Date/Time   CREATININE 1.30 09/12/2019 12:08 PM   CREATININE 1.18 10/04/2018 08:44 AM   GFR 53.07 (L) 09/12/2019 12:08 PM   GFRNONAA 58 (L) 10/04/2018 08:44 AM   GFRAA 67 10/04/2018 08:44 AM    BMP Latest Ref Rng & Units 09/12/2019 10/04/2018 12/17/2017  Glucose 70 - 99 mg/dL 103(H) 105(H) 121(H)  BUN 6 - 23 mg/dL 16 15 22   Creatinine 0.40 - 1.50 mg/dL 1.30 1.18 1.33  BUN/Creat Ratio 10 - 24 - 13 -  Sodium 135 - 145 mEq/L 136 139 139  Potassium 3.5 - 5.1 mEq/L 4.1 4.0 4.2  Chloride 96 - 112 mEq/L 101 99 104  CO2 19 - 32 mEq/L 29 22 28   Calcium 8.4 - 10.5 mg/dL 9.3 9.0 9.4   Multiple messages left for patient to return call. Will follow up in April to complete Hypertension disease state call.   Current antihypertensive regimen: According to chart Amlodipine 10 mg - 1 tablet daily . Any recent hospitalizations or ED visits since last visit with CPP?  No  Adherence Review: Is the patient currently on ACE/ARB medication? No Does the patient have >5 day gap between last estimated fill dates? CPP to review   Follow-Up:  Pharmacist Review  Debbora Dus, CPP notified  Margaretmary Dys, [place] Assistant [phone]  I have reviewed the care management and care coordination activities outlined in this encounter and I am certifying that I agree with the content of this note. No further action required.  Debbora Dus, PharmD Clinical Pharmacist [facility] [phone]

## 2020-05-14 ENCOUNTER — Ambulatory Visit: Payer: PPO | Admitting: Internal Medicine

## 2020-05-14 ENCOUNTER — Telehealth: Payer: Self-pay

## 2020-05-14 NOTE — Telephone Encounter (Signed)
Jonathan Glass, Please have him reschedule for a time that works for him.

## 2020-05-14 NOTE — Telephone Encounter (Signed)
[place] Night - Client Nonclinical Telephone Record AccessNurse Client [facility] Night - Client Client Site [place] Physician Viviana Simpler- MD Contact Type Call Who Is Calling Patient / Member / Family / Caregiver Caller Name [place] Phone Number [phone] Patient Name Jonathan Glass Patient DOB [birth year] Call Type Message Only Information Provided Reason for Call Request to [facility] Appointment Initial Comment Caller wants to cancel his appointment today at [facility] Provided caller with office hours and advised to call back during business hours. Caller declined triage. Disp. Time Disposition Final User 05/14/2020 7:20:27 AM General Information Provided Yes Pense, Lori Call Closed By: Gloris Ham Transaction Date/Time: 05/14/2020 7:18:12 AM (ET)

## 2020-05-14 NOTE — Telephone Encounter (Signed)
I will cancel the appointment at his request. He will need to reschedule because it is a 6 month F/U

## 2020-05-28 DIAGNOSIS — H6061 Unspecified chronic otitis externa, right ear: Secondary | ICD-10-CM | POA: Diagnosis not present

## 2020-05-29 ENCOUNTER — Ambulatory Visit (INDEPENDENT_AMBULATORY_CARE_PROVIDER_SITE_OTHER): Payer: PPO | Admitting: Internal Medicine

## 2020-05-29 ENCOUNTER — Encounter: Payer: Self-pay | Admitting: Internal Medicine

## 2020-05-29 ENCOUNTER — Other Ambulatory Visit: Payer: Self-pay

## 2020-05-29 DIAGNOSIS — M792 Neuralgia and neuritis, unspecified: Secondary | ICD-10-CM

## 2020-05-29 DIAGNOSIS — I25119 Atherosclerotic heart disease of native coronary artery with unspecified angina pectoris: Secondary | ICD-10-CM

## 2020-05-29 DIAGNOSIS — M069 Rheumatoid arthritis, unspecified: Secondary | ICD-10-CM | POA: Diagnosis not present

## 2020-05-29 DIAGNOSIS — F324 Major depressive disorder, single episode, in partial remission: Secondary | ICD-10-CM

## 2020-05-29 DIAGNOSIS — J449 Chronic obstructive pulmonary disease, unspecified: Secondary | ICD-10-CM

## 2020-05-29 NOTE — Assessment & Plan Note (Signed)
Severe Some control with gabapentin and lyrica
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
## 2020-05-29 NOTE — Assessment & Plan Note (Signed)
Still with pain but fair control on the leflunomide

## 2020-05-29 NOTE — Assessment & Plan Note (Signed)
Easy DOE is stable Likely combination of heart/pulmonary

## 2020-05-29 NOTE — Assessment & Plan Note (Signed)
Controlled with symbicort and montelukast

## 2020-05-29 NOTE — Assessment & Plan Note (Signed)
Ongoing chronic but mild symptoms No worsening Continue the sertraline

## 2020-06-20 DIAGNOSIS — M792 Neuralgia and neuritis, unspecified: Secondary | ICD-10-CM

## 2020-07-08 DIAGNOSIS — H60331 Swimmer's ear, right ear: Secondary | ICD-10-CM | POA: Diagnosis not present

## 2020-07-31 ENCOUNTER — Telehealth: Payer: Self-pay | Admitting: Diagnostic Neuroimaging

## 2020-07-31 NOTE — Telephone Encounter (Signed)
Pt called, I cannot move my legs, have no feelings both legs. Dr. Silvio Pate recommended I get a sooner appt. Would like a call from the nurse.

## 2020-07-31 NOTE — Telephone Encounter (Addendum)
Called patient and advised he go to ED immediately for evaluation. He stated his PCP advised the same. He has a new patient appointment here on 09/06/20, and I advised we will not cancel it. Patient verbalized understanding, appreciation.

## 2020-08-05 ENCOUNTER — Institutional Professional Consult (permissible substitution): Payer: PPO | Admitting: Diagnostic Neuroimaging

## 2020-08-09 ENCOUNTER — Institutional Professional Consult (permissible substitution): Payer: PPO | Admitting: Diagnostic Neuroimaging

## 2020-09-02 NOTE — Telephone Encounter (Signed)
Pregabalin removed from his med list

## 2020-09-06 ENCOUNTER — Telehealth: Payer: Self-pay

## 2020-09-06 ENCOUNTER — Institutional Professional Consult (permissible substitution): Payer: PPO | Admitting: Diagnostic Neuroimaging

## 2020-09-06 NOTE — Chronic Care Management (AMB) (Addendum)
    Chronic Care Management Pharmacy Assistant   Name: Jonathan Glass  MRN: [id number] DOB: [birth year]  Reason for Encounter: Disease State - CHF  Recent office visits:  05/29/20 Arvilla Market, PCP - No medication changes  Recent consult visits:  07/29/20 -  [facility] no data found 07/08/20 - ENT no medication changes   Hospital visits:  None in previous 6 months  Medications: Outpatient Encounter Medications as of 09/06/2020  Medication Sig   albuterol (PROVENTIL HFA;VENTOLIN HFA) 108 (90 Base) MCG/ACT inhaler Inhale 2 puffs into the lungs every 6 (six) hours as needed for wheezing or shortness of breath.   allopurinol (ZYLOPRIM) 300 MG tablet Take 300 mg by mouth daily. 100 mg + 300 mg daily   amLODipine (NORVASC) 10 MG tablet Take 10 mg by mouth daily.   aspirin EC 81 MG tablet Take 81 mg by mouth daily.   budesonide-formoterol (SYMBICORT) 160-4.5 MCG/ACT inhaler Inhale 2 puffs into the lungs 2 (two) times daily.   colchicine (COLCRYS) 0.6 MG tablet Take 1 tablet (0.6 mg total) by mouth daily.   diazepam (VALIUM) 2 MG tablet Take 1 tablet (2 mg total) by mouth 3 (three) times daily as needed for anxiety.   ezetimibe (ZETIA) 10 MG tablet Take 5 mg by mouth daily.   fluticasone (FLONASE) 50 MCG/ACT nasal spray Place 2 sprays into the nose as needed for allergies or rhinitis.   furosemide (LASIX) 40 MG tablet Take 40 mg by mouth daily.   gabapentin (NEURONTIN) 300 MG capsule Take 600 mg by mouth 3 (three) times daily.   leflunomide (ARAVA) 20 MG tablet    montelukast (SINGULAIR) 10 MG tablet Take 1 tablet (10 mg total) by mouth at bedtime.   omeprazole (PRILOSEC) 20 MG capsule Take 1 capsule (20 mg total) by mouth daily.   sertraline (ZOLOFT) 100 MG tablet Take 1 tablet (100 mg total) by mouth daily.   tiotropium (SPIRIVA HANDIHALER) 18 MCG inhalation capsule Place 1 capsule (18 mcg total) into inhaler and inhale daily.   No facility-administered encounter medications on file as of  09/06/2020.   Current heart failure regimen: Furosemide 40 mg - 1 tablet daily     Do you see a cardiologist? Yes             Name: Sherren Mocha MD Last visit?  01/05/2020 Upcoming visit? Oct 2022  Star Rating Drugs:  Medication:  Last Fill: Day Supply  None Identified  Attempted contact with [name] 3 times on 09/06/20, 09/09/20, 09/11/20 Unsuccessful outreach. Will attempt contact next month.   Debbora Dus, CPP notified  Avel Sensor, [place] Assistant [phone]  I have reviewed the care management and care coordination activities outlined in this encounter and I am certifying that I agree with the content of this note. No further action required.  Debbora Dus, PharmD Clinical Pharmacist [facility] at [facility] [phone]

## 2020-09-23 ENCOUNTER — Telehealth: Payer: Self-pay | Admitting: Cardiovascular Disease

## 2020-09-23 NOTE — Telephone Encounter (Signed)
Took direct phone call to triage from physical therapist.  She was calling to let cardiology know that Pt has had 2 separate readings of heart rate 45 and 48.  She states Pt is asymptomatic.  She is calling d/t policy for her to notify cardiology.  Also she is calling to let cardiology know that Pt has stopped taking lasix as he cannot ambulate at this time.  This is why PT is in Pt home.  He has some LE edema, PT calls 2+.  But Pt refuses lasix.   Again, she is just notifying us d/t policy.  Follow up scheduled with Dr. Burt Knack in October per recall.  Advised will let Dr. Burt Knack know and will call back if any further advisement.  PT will call if Pt becomes symptomatic.

## 2020-09-23 NOTE — Telephone Encounter (Signed)
STAT if HR is under 50 or over 120 (normal HR is 60-100 beats per minute)  What is your heart rate? today 48  Do you have a log of your heart rate readings (document readings)? 45  Do you have any other symptoms? no  Doyne Keel, physical therapist with [facility], calling to inform the patient's HR has been low. She states today it was 48. She says his BP today was 138/70. She says his BP was 148/66 June 5th , and on the 8th 128/60. She states his edema gets worse in the evening and is maybe 2+, in his ankles and feet. She states he has also stopped his his lasix. She states he is not able to bare weight and it is hard for him to get up to go to the bathroom. [phone]

## 2020-11-04 ENCOUNTER — Ambulatory Visit: Payer: PPO | Admitting: Diagnostic Neuroimaging

## 2020-11-15 ENCOUNTER — Ambulatory Visit: Payer: PPO | Admitting: Diagnostic Neuroimaging

## 2020-11-15 ENCOUNTER — Encounter: Payer: Self-pay | Admitting: Diagnostic Neuroimaging

## 2020-11-15 VITALS — BP 136/89 | HR 83

## 2020-11-15 DIAGNOSIS — R29898 Other symptoms and signs involving the musculoskeletal system: Secondary | ICD-10-CM | POA: Diagnosis not present

## 2020-11-15 DIAGNOSIS — M48062 Spinal stenosis, lumbar region with neurogenic claudication: Secondary | ICD-10-CM

## 2020-11-15 DIAGNOSIS — M542 Cervicalgia: Secondary | ICD-10-CM

## 2020-11-15 NOTE — Patient Instructions (Signed)
-   check MRI cervical and lumbar spine - check EMG/NCS - check labs
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
## 2020-11-16 LAB — HEMOGLOBIN A1C
Est. average glucose Bld gHb Est-mCnc: 100 mg/dL
Hgb A1c MFr Bld: 5.1 % (ref 4.8–5.6)

## 2020-11-16 LAB — VITAMIN B12: Vitamin B-12: 324 pg/mL (ref 232–1245)

## 2020-11-19 ENCOUNTER — Telehealth: Payer: Self-pay | Admitting: Diagnostic Neuroimaging

## 2020-11-19 NOTE — Telephone Encounter (Signed)
health team order sent to GI. NPR they will reach out to the patient to schedule.

## 2020-11-20 ENCOUNTER — Encounter: Payer: Self-pay | Admitting: *Deleted

## 2020-12-03 ENCOUNTER — Other Ambulatory Visit: Payer: Self-pay

## 2020-12-03 ENCOUNTER — Encounter: Payer: Self-pay | Admitting: Internal Medicine

## 2020-12-03 ENCOUNTER — Telehealth (INDEPENDENT_AMBULATORY_CARE_PROVIDER_SITE_OTHER): Payer: PPO | Admitting: Internal Medicine

## 2020-12-03 DIAGNOSIS — K592 Neurogenic bowel, not elsewhere classified: Secondary | ICD-10-CM

## 2020-12-03 DIAGNOSIS — I5032 Chronic diastolic (congestive) heart failure: Secondary | ICD-10-CM

## 2020-12-03 DIAGNOSIS — J449 Chronic obstructive pulmonary disease, unspecified: Secondary | ICD-10-CM | POA: Diagnosis not present

## 2020-12-03 DIAGNOSIS — G8220 Paraplegia, unspecified: Secondary | ICD-10-CM | POA: Diagnosis not present

## 2020-12-03 DIAGNOSIS — F324 Major depressive disorder, single episode, in partial remission: Secondary | ICD-10-CM | POA: Diagnosis not present

## 2020-12-03 DIAGNOSIS — K5900 Constipation, unspecified: Secondary | ICD-10-CM

## 2020-12-03 NOTE — Assessment & Plan Note (Signed)
Discussed with daughter---start miralax 17 g daily MOM or suppository every 3 days prn
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
## 2020-12-03 NOTE — Assessment & Plan Note (Signed)
Apparently related to severe spinal stenosis Now chair bound Daughter is caring for him full time Will plan to make home visit in the near future since he can't get in

## 2020-12-03 NOTE — Assessment & Plan Note (Signed)
Has been off the sertraline for 1 month Both he and daughter don't note much difference---so will not restart Hopefully I can get a better idea about his mood at the in person visit

## 2020-12-03 NOTE — Assessment & Plan Note (Signed)
Breathing seems to be okay with symbicort and spiriva

## 2020-12-03 NOTE — Assessment & Plan Note (Signed)
Doesn't appear to have exacerbation\ Has lost weight---swelling not an issue

## 2020-12-05 ENCOUNTER — Telehealth: Payer: Self-pay

## 2020-12-05 NOTE — Progress Notes (Addendum)
Chronic Care Management Pharmacy Assistant   Name: Jonathan Glass  MRN: [id number] DOB: [birth year]  Reason for Encounter: General Adherence   Recent office visits:  12/03/2020 - Viviana Simpler, MD - Video Visit - Patient presented for paraplegia. Stop: sertraline (ZOLOFT) 100 MG tablet. Off Sertraline for 1 month - will not restart. Patient is now chair bound with daughter caring for him full time. Dr. Silvio Pate will make home visit in future.   Recent consult visits:  11/26/2020 - [facility] - Telephone visit - Patient presented for He is now completely paralyzed from the waist down. He is unable to walk/stand independently at this time. Ordered [employer] QuickRaiser 205 from [email or web address]. Will request for assembly and additional education/training upon delivery.  11/20/2020 - [facility] - Telephone visit - Patient presented for DME and [facility] re-auth.  11/15/2020 - Andrey Spearman, MD (Neurology) - Patient presented for weakness of both lower extremities. Labs: A1c, Vit B12, MR Cervical Spine wo contrast, MR Lumbar Spine wo contrast and NCV with EMG.  11/01/2020 - [place] visit for follow up with patient. No medication changes.   09/10/2020 - [facility] - Patient presented for OT consult  was for cognitive assessment penultimate power mobility assessment. Manual Wheelchair, stand up lift/sling and shower chair/commode with wheels ordered.  Hospital visits:   None in previous 6 months  Medications: Outpatient Encounter Medications as of 12/05/2020  Medication Sig   albuterol (PROVENTIL HFA;VENTOLIN HFA) 108 (90 Base) MCG/ACT inhaler Inhale 2 puffs into the lungs every 6 (six) hours as needed for wheezing or shortness of breath.   allopurinol (ZYLOPRIM) 300 MG tablet Take 300 mg by mouth daily. 100 mg + 300 mg daily   amLODipine (NORVASC) 10 MG tablet Take 10 mg by mouth daily.   aspirin EC 81 MG tablet Take 81 mg by mouth daily.    budesonide-formoterol (SYMBICORT) 160-4.5 MCG/ACT inhaler Inhale 2 puffs into the lungs 2 (two) times daily.   colchicine (COLCRYS) 0.6 MG tablet Take 1 tablet (0.6 mg total) by mouth daily.   diazepam (VALIUM) 2 MG tablet Take 1 tablet (2 mg total) by mouth 3 (three) times daily as needed for anxiety.   ezetimibe (ZETIA) 10 MG tablet Take 5 mg by mouth daily.   fluticasone (FLONASE) 50 MCG/ACT nasal spray Place 2 sprays into the nose as needed for allergies or rhinitis.   gabapentin (NEURONTIN) 300 MG capsule Take 600 mg by mouth 3 (three) times daily.   leflunomide (ARAVA) 20 MG tablet    montelukast (SINGULAIR) 10 MG tablet Take 1 tablet (10 mg total) by mouth at bedtime.   omeprazole (PRILOSEC) 20 MG capsule Take 1 capsule (20 mg total) by mouth daily.   tiotropium (SPIRIVA HANDIHALER) 18 MCG inhalation capsule Place 1 capsule (18 mcg total) into inhaler and inhale daily.   No facility-administered encounter medications on file as of 12/05/2020.   [place] on 12/06/2020 for general disease state and medication adherence call.  Star Medications: Medication Name/mg Last Fill Days Supply None documented in chart  What concerns do you have about your medications? None  The patient denies side effects with his medications.   How often do you forget or accidentally miss a dose? Never  Do you use a pillbox? Yes  Are you having any problems getting your medications from your pharmacy? No  Has the cost of your medications been a concern? No  Since last visit with  CPP, no interventions have been made.  The patient has not had an ED visit since last contact.   The patient reports the following problems with their health. Patient states he has a cold for about four days. Patient states the air conditioning gave him a cold. His caregiver closed the vents in his house to make him more comfortable.   he denies  concerns or questions for Debbora Dus, PharmD at this  time.   Counseled patient on:  Great job taking medications  Care Gaps: Annual wellness visit in last year? No 11/10/2019 Most Recent BP reading: 136/89 on 11/15/2020  Most recent A1C reading: 5.1 on 11/15/2020 Last eye exam / retinopathy screening: N/a Last diabetic foot exam: N/a  Nerve Conduction Study @ [facility] 01/02/2021 Cardiology appointment on 01/13/2021  Debbora Dus, CPP notified  Marijean Niemann, [employer] Assistant [phone]   I have reviewed the care management and care coordination activities outlined in this encounter and I am certifying that I agree with the content of this note. No further action required.  Debbora Dus, PharmD Clinical Pharmacist [facility] [phone]

## 2020-12-18 ENCOUNTER — Encounter: Payer: Self-pay | Admitting: Internal Medicine

## 2020-12-18 ENCOUNTER — Other Ambulatory Visit: Payer: Self-pay

## 2020-12-18 ENCOUNTER — Ambulatory Visit: Payer: PPO | Admitting: Internal Medicine

## 2020-12-18 VITALS — BP 122/56 | HR 60 | Resp 18

## 2020-12-18 DIAGNOSIS — G8220 Paraplegia, unspecified: Secondary | ICD-10-CM | POA: Diagnosis not present

## 2020-12-18 DIAGNOSIS — I700 Atherosclerosis of aorta: Secondary | ICD-10-CM | POA: Diagnosis not present

## 2020-12-18 DIAGNOSIS — M792 Neuralgia and neuritis, unspecified: Secondary | ICD-10-CM

## 2020-12-18 DIAGNOSIS — E441 Mild protein-calorie malnutrition: Secondary | ICD-10-CM

## 2020-12-18 DIAGNOSIS — I10 Essential (primary) hypertension: Secondary | ICD-10-CM | POA: Diagnosis not present

## 2020-12-18 DIAGNOSIS — Z23 Encounter for immunization: Secondary | ICD-10-CM | POA: Diagnosis not present

## 2020-12-18 DIAGNOSIS — J449 Chronic obstructive pulmonary disease, unspecified: Secondary | ICD-10-CM | POA: Diagnosis not present

## 2020-12-18 DIAGNOSIS — I5032 Chronic diastolic (congestive) heart failure: Secondary | ICD-10-CM | POA: Diagnosis not present

## 2020-12-18 DIAGNOSIS — F39 Unspecified mood [affective] disorder: Secondary | ICD-10-CM

## 2020-12-18 NOTE — Assessment & Plan Note (Signed)
On CT scan Given overall status, will hold off on statin

## 2020-12-18 NOTE — Assessment & Plan Note (Signed)
From apparent spinal stenosis  Chair bound Has [name] but needs new sling (full lift--not sit to stand) Daughter takes care of him at home--[facility]
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
## 2020-12-18 NOTE — Assessment & Plan Note (Signed)
Has lost considerable weight Can't weigh Discussed trying to maintain intake despite lack of appetite

## 2020-12-18 NOTE — Assessment & Plan Note (Signed)
Compensated now with weight loss and chair bound status No Rx

## 2020-12-18 NOTE — Addendum Note (Signed)
Addended by: Pilar Grammes on: 12/18/2020 03:35 PM   Modules accepted: Orders

## 2020-12-18 NOTE — Assessment & Plan Note (Signed)
Breathing okay on symbicort and tiotropium

## 2020-12-18 NOTE — Assessment & Plan Note (Signed)
Some reactive depression---mostly anxiety Uses the diazepam prn

## 2020-12-18 NOTE — Assessment & Plan Note (Signed)
BP Readings from Last 3 Encounters:  12/18/20 (!) 122/56  11/15/20 136/89  05/29/20 112/64   Low and some symptoms when gets up on lift Will decrease amlodipine to 5mg  daily

## 2020-12-18 NOTE — Assessment & Plan Note (Signed)
Okay to add extra 300mg  of gabapentin at bedtime --or even 600mg  more (for total 1200mg )

## 2020-12-27 ENCOUNTER — Ambulatory Visit: Payer: PPO | Admitting: Diagnostic Neuroimaging

## 2021-01-02 ENCOUNTER — Encounter: Payer: PPO | Admitting: Diagnostic Neuroimaging

## 2021-01-13 ENCOUNTER — Ambulatory Visit: Payer: PPO | Admitting: Cardiovascular Disease

## 2021-02-03 ENCOUNTER — Telehealth: Payer: Self-pay

## 2021-02-03 NOTE — Chronic Care Management (AMB) (Signed)
    Chronic Care Management Pharmacy Assistant   Name: Jonathan Glass  MRN: [id number] DOB: 02/13/1941   Reason for Encounter: Reminder Call   Conditions to be addressed/monitored: HTN, HLD, and COPD    Medications: Outpatient Encounter Medications as of 02/03/2021  Medication Sig   albuterol (PROVENTIL HFA;VENTOLIN HFA) 108 (90 Base) MCG/ACT inhaler Inhale 2 puffs into the lungs every 6 (six) hours as needed for wheezing or shortness of breath.   allopurinol (ZYLOPRIM) 300 MG tablet Take 300 mg by mouth daily. 100 mg + 300 mg daily   amLODipine (NORVASC) 10 MG tablet Take 5 mg by mouth daily.   aspirin EC 81 MG tablet Take 81 mg by mouth daily.   budesonide-formoterol (SYMBICORT) 160-4.5 MCG/ACT inhaler Inhale 2 puffs into the lungs 2 (two) times daily.   colchicine (COLCRYS) 0.6 MG tablet Take 1 tablet (0.6 mg total) by mouth daily.   diazepam (VALIUM) 2 MG tablet Take 1 tablet (2 mg total) by mouth 3 (three) times daily as needed for anxiety.   ezetimibe (ZETIA) 10 MG tablet Take 5 mg by mouth daily.   fluticasone (FLONASE) 50 MCG/ACT nasal spray Place 2 sprays into the nose as needed for allergies or rhinitis.   gabapentin (NEURONTIN) 300 MG capsule Take 600 mg by mouth 3 (three) times daily.   leflunomide (ARAVA) 20 MG tablet    montelukast (SINGULAIR) 10 MG tablet Take 1 tablet (10 mg total) by mouth at bedtime.   omeprazole (PRILOSEC) 20 MG capsule Take 1 capsule (20 mg total) by mouth daily.   tiotropium (SPIRIVA HANDIHALER) 18 MCG inhalation capsule Place 1 capsule (18 mcg total) into inhaler and inhale daily.   No facility-administered encounter medications on file as of 02/03/2021.   Jonathan Glass  did not answer the telephone  to remind him of his upcoming telephone visit with Jonathan Glass on 02/10/21 at 8:30am  . Patient was reminded to have all medications, supplements and any blood glucose and blood pressure readings available for review at appointment.  Detailed message left on machine     Star Rating Drugs: Medication:  Last Fill: Day Supply None identified  Amlodipine 10mg  -  -   Jonathan Glass, CPP notified  Jonathan Glass, [place] Assistant [phone]  Total time spent for month CPA: 10 min.s

## 2021-02-10 ENCOUNTER — Telehealth: Payer: PPO

## 2021-02-10 ENCOUNTER — Telehealth: Payer: Self-pay

## 2021-02-10 NOTE — Progress Notes (Deleted)
Chronic Care Management Pharmacy Note  02/10/2021 Name:  Jonathan Glass MRN:  [id number] DOB:  1941-01-19  Summary: ***  Recommendations/Changes made from today's visit: ***  Plan: ***   Subjective: Jonathan Glass is an 80 y.o. year old male who is a primary patient of Letvak, Theophilus Kinds, MD.  The CCM team was consulted for assistance with disease management and care coordination needs.    {CCMTELEPHONEFACETOFACE:21091510} for {CCMINITIALFOLLOWUPCHOICE:21091511} in response to provider referral for pharmacy case management and/or care coordination services.   Consent to Services:  {CCMCONSENTOPTIONS:25074}  Patient Care Team: [name], MD as PCP - Cyndia Diver, MD as PCP - Cardiology (Cardiology) Debbora Dus, [facility] as Pharmacist (Pharmacist)  Recent office visits: ***  Recent consult visits: [facility] visits: {Hospital DC Yes/No:25215}   Objective:  Lab Results  Component Value Date   CREATININE 1.30 09/12/2019   BUN 16 09/12/2019   GFR 53.07 (L) 09/12/2019   GFRNONAA 58 (L) 10/04/2018   GFRAA 67 10/04/2018   NA 136 09/12/2019   K 4.1 09/12/2019   CALCIUM 9.3 09/12/2019   CO2 29 09/12/2019   GLUCOSE 103 (H) 09/12/2019    Lab Results  Component Value Date/Time   HGBA1C 5.1 11/15/2020 11:56 AM   GFR 53.07 (L) 09/12/2019 12:08 PM   GFR 55.18 (L) 12/17/2017 07:39 AM    Lab Results  Component Value Date   CHOL 175 08/11/2017   HDL 45.80 08/11/2017   LDLCALC 100 (H) 08/11/2017   TRIG 143.0 08/11/2017   CHOLHDL 4 08/11/2017    Hepatic Function Latest Ref Rng & Units 09/12/2019 12/17/2017 09/10/2017  Total Protein 6.0 - 8.3 g/dL 7.0 6.9 6.7  Albumin 3.5 - 5.2 g/dL 3.9 3.7 3.9  AST 0 - 37 U/L _0 ALT 0 - 53 U/L _1 Alk Phosphatase 39 - 117 U/L 84 76 71  Total Bilirubin 0.2 - 1.2 mg/dL 0.7 0.7 0.6  Bilirubin, Direct 0.0 - 0.3 mg/dL - - -    Lab Results  Component Value Date/Time   TSH 2.01 07/20/2017  09:38 AM   TSH 1.82 06/21/2012 08:47 AM   FREET4 0.76 09/11/2014 11:35 AM    CBC Latest Ref Rng & Units 09/12/2019 10/04/2018 12/17/2017  WBC 4.0 - 10.5 K/uL 9.2 6.6 7.6  Hemoglobin 13.0 - 17.0 g/dL 13.5 14.7 14.6  Hematocrit 39.0 - 52.0 % 39.9 44.9 43.7  Platelets 150.0 - 400.0 K/uL 182.0 201 189.0    No results found for: VD25OH  Clinical ASCVD: Yes  The ASCVD Risk score (Arnett DK, et al., 2019) failed to calculate for the following reasons:   The 2019 ASCVD risk score is only valid for ages 45 to 101    Depression screen PHQ 2/9 01/27/2015 12/21/2012  Decreased Interest 0 1  Down, Depressed, Hopeless 0 0  PHQ - 2 Score 0 1  Some recent data might be hidden    Social History   Tobacco Use  Smoking Status Former   Packs/day: 1.50   Years: 15.00   Pack years: 22.50   Types: Cigarettes   Quit date: 03/16/1981   Years since quitting: 39.9  Smokeless Tobacco Never   BP Readings from Last 3 Encounters:  12/18/20 (!) 122/56  11/15/20 136/89  05/29/20 112/64   Pulse Readings from Last 3 Encounters:  12/18/20 60  11/15/20 83  05/29/20 60   Wt Readings from Last 3 Encounters:  05/29/20 184 lb (83.5 kg)  04/11/20 178 lb (80.7 kg)  01/05/20 180 lb 9.6 oz (81.9 kg)   BMI Readings from Last 3 Encounters:  05/29/20 26.40 kg/m  04/11/20 25.54 kg/m  01/05/20 25.19 kg/m    Assessment/Interventions: Review of patient past medical history, allergies, medications, health status, including review of consultants reports, laboratory and other test data, was performed as part of comprehensive evaluation and provision of chronic care management services.   SDOH:  (Social Determinants of Health) assessments and interventions performed: Yes  SDOH Screenings   Alcohol Screen: Not on file  Depression (PHQ2-9): Not on file  Financial Resource Strain: Not on file  Food Insecurity: Not on file  Housing: Not on file  Physical Activity: Not on file  Social Connections: Not on file   Stress: Not on file  Tobacco Use: Medium Risk   Smoking Tobacco Use: Former   Smokeless Tobacco Use: Never   Passive Exposure: Not on file  Transportation Needs: Not on file    CCM Care Plan  Allergies  Allergen Reactions   Statins Other (See Comments)    REACTION: myalgia with crestor,advicor,lipitor,pravachol   Lyrica [Pregabalin] Other (See Comments)    Worse swelling Felt bad   Hydrocodone-Acetaminophen Itching   Tramadol Hcl Itching and Rash    Medications Reviewed Today     Reviewed by [name], MD (Physician) on 12/18/20 at [place] List Status: <None>   Medication Order Taking? Sig Documenting Provider Last Dose Status Informant  albuterol (PROVENTIL HFA;VENTOLIN HFA) 108 (90 Base) MCG/ACT inhaler [id number]  Inhale 2 puffs into the lungs every 6 (six) hours as needed for wheezing or shortness of breath. [provider]  Active Self  allopurinol (ZYLOPRIM) 300 MG tablet [id number]  Take 300 mg by mouth daily. 100 mg + 300 mg daily [provider]  Active Self  amLODipine (NORVASC) 10 MG tablet [id number]  Take 10 mg by mouth daily. [provider]  Active Self  aspirin EC 81 MG tablet [id number]  Take 81 mg by mouth daily. [provider]  Active   budesonide-formoterol [facility]) 160-4.5 MCG/ACT inhaler [id number]  Inhale 2 puffs into the lungs 2 (two) times daily. [provider]  Active   colchicine (COLCRYS) 0.6 MG tablet [id number]  Take 1 tablet (0.6 mg total) by mouth daily. [name], MD  Active Self  diazepam (VALIUM) 2 MG tablet [id number]  Take 1 tablet (2 mg total) by mouth 3 (three) times daily as needed for anxiety. [name], MD  Active   ezetimibe (ZETIA) 10 MG tablet [id number]  Take 5 mg by mouth daily. [provider]  Active Self  fluticasone (FLONASE) 50 MCG/ACT nasal spray [id number]  Place 2 sprays into the nose as needed for allergies or rhinitis. [provider]  Active  Self  gabapentin (NEURONTIN) 300 MG capsule [id number]  Take 600 mg by mouth 3 (three) times daily. [provider]  Active   leflunomide (ARAVA) 20 MG tablet [id number]   [provider]  Active   montelukast (SINGULAIR) 10 MG tablet [id number]  Take 1 tablet (10 mg total) by mouth at bedtime. [name], MD  Active   omeprazole (PRILOSEC) 20 MG capsule [id number]  Take 1 capsule (20 mg total) by mouth daily. [name], MD  Active Self  tiotropium [facility] HANDIHALER) 18 MCG inhalation capsule [id number]  Place 1 capsule (18 mcg total) into inhaler and inhale daily. Laverle Hobby, MD  Expired 01/05/20 2359  Patient Active Problem List   Diagnosis Date Noted   Paraplegia ([place]) 12/03/2020   Constipation due to neurogenic bowel 12/03/2020   Aortic atherosclerosis ([place]) 11/10/2019   Malnutrition of mild degree ([place]) 11/10/2019   Left sided abdominal pain 09/12/2019   Chronic renal disease, stage III ([place]) 09/09/2018   COPD (chronic obstructive pulmonary disease) ([place]) 08/11/2017   Fundic gland polyposis of stomach 08/04/2017   Primary osteoarthritis, right shoulder 05/03/2017   Asthma exacerbation, mild 04/29/2016   Chronic otitis externa of both ears 12/17/2015   Bilateral impacted cerumen 10/28/2015   Chronic diastolic heart failure ([place]) 09/13/2015   Chronic mycotic otitis externa 07/09/2015   Mixed conductive and sensorineural hearing loss of both ears 04/30/2015   Constipation 11/23/2014   Advance directive discussed with patient 09/11/2014   Mood disorder ([place]) 02/02/2014   Vertigo 08/25/2013   Peripheral neuropathic pain 02/29/2012   Routine general medical examination at a health care facility 05/15/2011   MENIERE'S DISEASE 02/18/2010   COLONIC POLYPS, ADENOMATOUS, HX OF 01/03/2009   ALLERGIC RHINITIS 06/01/2008   Arthritis, rheumatoid ([place]) 12/14/2006   Hyperlipemia 09/01/2006   GOUT 09/01/2006   Essential hypertension,  benign 09/01/2006   Atherosclerotic heart disease of native coronary artery with angina pectoris ([place]) 09/01/2006   GERD 09/01/2006   OSTEOARTHRITIS 09/01/2006    Immunization History  Administered Date(s) Administered   Fluad Quad(high Dose 65+) 11/08/2018, 11/23/2019, 12/18/2020   Influenza Split 12/01/2011   Influenza Whole 12/31/2006, 12/13/2007, 12/20/2008, 11/14/2009   Influenza, High Dose Seasonal PF 11/27/2016   Influenza,inj,Quad PF,6+ Mos 12/01/2012, 11/14/2013, 11/23/2014, 11/27/2015, 12/17/2017   Moderna Sars-Covid-2 Vaccination 04/19/2019, 05/17/2019, 12/22/2019   PFIZER(Purple Top)SARS-COV-2 Vaccination 05/10/2019, 05/31/2019   Pneumococcal Conjugate-13 07/25/2013, 09/11/2014   Pneumococcal Polysaccharide-23 03/16/2000, 06/21/2012   Pneumococcal-Unspecified 12/18/1996, 11/15/2002, 01/01/2006   Td 03/16/2002, 03/16/2013   Tdap 11/17/2011   Zoster, Live 06/21/2012    Conditions to be addressed/monitored:  {USCCMDZASSESSMENTOPTIONS:23563}  There are no care plans that you recently modified to display for this patient.   Current Barriers:  {pharmacybarriers:24917}  Pharmacist Clinical Goal(s):  Patient will {PHARMACYGOALCHOICES:24921} through collaboration with PharmD and provider.   Interventions: 1:1 collaboration with [name], MD regarding development and update of comprehensive plan of care as evidenced by provider attestation and co-signature Inter-disciplinary care team collaboration (see longitudinal plan of care) Comprehensive medication review performed; medication list updated in electronic medical record  Hypertension (BP goal {CHL HP UPSTREAM Pharmacist BP [phone]}) -{US controlled/uncontrolled:25276} -Current treatment: Amlodipine 5 mg - 1 tablet daily  -Medications previously tried: ***  -Current home readings: *** -Current dietary habits: *** -Current exercise habits: *** -{ACTIONS;DENIES/REPORTS:21021675::"Denies"}  hypotensive/hypertensive symptoms -Educated on {CCM BP Counseling:25124} -Counseled to monitor BP at home ***, document, and provide log at future appointments -{CCMPHARMDINTERVENTION:25122}  COPD (Goal: control symptoms and prevent exacerbations) -{US controlled/uncontrolled:25276} -Current treatment  *** -Medications previously tried: ***  -Gold Grade: {CHL HP Upstream Pharm COPD Gold POEUM:3536144315} -Current COPD Classification:  {CHL HP Upstream Pharm COPD Classification:8104825205} -MMRC/CAT score: *** -Pulmonary function testing: *** -Exacerbations requiring treatment in last 6 months: *** -Patient {Actions; denies-reports:120008} consistent use of maintenance inhaler -Frequency of rescue inhaler use: *** -Counseled on {CCMINHALERCOUNSELING:25121} -{CCMPHARMDINTERVENTION:25122}  Depression/Anxiety (Goal: ***) -{US controlled/uncontrolled:25276} -Current treatment: *** -Medications previously tried/failed: *** -PHQ9: *** -GAD7: *** -Connected with *** for mental health support -Educated on {CCM mental health counseling:25127} -{CCMPHARMDINTERVENTION:25122}  *** (Goal: ***) -{US controlled/uncontrolled:25276} -Current treatment  *** -Medications previously tried: ***  -{CCMPHARMDINTERVENTION:25122}  Patient Goals/Self-Care Activities Patient will:  - {  pharmacypatientgoals:24919}  Follow Up Plan: {CM FOLLOW UP FBPP:94327}   Medication Assistance: {MEDASSISTANCEINFO:25044}  Compliance/Adherence/Medication fill history: Care Gaps: ***  Star-Rating Drugs: ***  Patient's preferred pharmacy is:  [facility], [place], [address], [place] [address] Phone: [phone] Fax: [phone]  [id number] [place], [id number]: [phone]: [phone] [facility]  Uses pill box? {Yes or If no, why  not?:20788} Pt endorses ***% compliance  We discussed: {Pharmacy options:24294} Patient decided to: {US Pharmacy PETKK:44695} Care Plan and Follow Up Patient Decision:  {FOLLOWUP:24991}  Plan: {CM FOLLOW UP PQHKU:57505} MDebbora Dus PharmD Clinical Pharmacist [phone]

## 2021-02-10 NOTE — Telephone Encounter (Signed)
  Chronic Care Management   Outreach Note  02/10/2021 Name: Jonathan Glass MRN: [id number] DOB: 24-Sep-1940  Referred by: [name], MD Reason for referral : Chronic Care Management (Unsuccessful outreach)   An unsuccessful telephone outreach was attempted today. The patient was referred to the pharmacist for assistance with care management and care coordination.   Debbora Dus, PharmD Clinical Pharmacist [facility] [phone]

## 2021-02-12 ENCOUNTER — Encounter: Payer: Self-pay | Admitting: Internal Medicine

## 2021-02-12 NOTE — Telephone Encounter (Signed)
Patient needs a lift, sent the order to [facility]. They will reach out to the patient to schedule.

## 2021-02-25 ENCOUNTER — Other Ambulatory Visit: Payer: Self-pay

## 2021-02-25 ENCOUNTER — Ambulatory Visit (HOSPITAL_COMMUNITY)
Admission: RE | Admit: 2021-02-25 | Discharge: 2021-02-25 | Disposition: A | Discharge status: Home / Self Care | Payer: PPO | Source: Ambulatory Visit | Attending: Diagnostic Neuroimaging | Admitting: Diagnostic Neuroimaging

## 2021-02-25 DIAGNOSIS — R29898 Other symptoms and signs involving the musculoskeletal system: Secondary | ICD-10-CM | POA: Diagnosis not present

## 2021-02-25 DIAGNOSIS — M48062 Spinal stenosis, lumbar region with neurogenic claudication: Secondary | ICD-10-CM | POA: Diagnosis not present

## 2021-02-25 DIAGNOSIS — M47816 Spondylosis without myelopathy or radiculopathy, lumbar region: Secondary | ICD-10-CM | POA: Diagnosis not present

## 2021-02-25 DIAGNOSIS — M4312 Spondylolisthesis, cervical region: Secondary | ICD-10-CM | POA: Diagnosis not present

## 2021-02-25 DIAGNOSIS — M4316 Spondylolisthesis, lumbar region: Secondary | ICD-10-CM | POA: Diagnosis not present

## 2021-02-25 DIAGNOSIS — M542 Cervicalgia: Secondary | ICD-10-CM | POA: Diagnosis not present

## 2021-02-25 DIAGNOSIS — R29890 Loss of height: Secondary | ICD-10-CM | POA: Diagnosis not present

## 2021-02-25 DIAGNOSIS — R200 Anesthesia of skin: Secondary | ICD-10-CM | POA: Diagnosis not present

## 2021-02-26 ENCOUNTER — Ambulatory Visit: Payer: PPO | Admitting: Internal Medicine

## 2021-02-26 ENCOUNTER — Encounter: Payer: Self-pay | Admitting: Internal Medicine

## 2021-02-26 DIAGNOSIS — R197 Diarrhea, unspecified: Secondary | ICD-10-CM

## 2021-02-26 DIAGNOSIS — M069 Rheumatoid arthritis, unspecified: Secondary | ICD-10-CM | POA: Diagnosis not present

## 2021-02-26 DIAGNOSIS — M792 Neuralgia and neuritis, unspecified: Secondary | ICD-10-CM

## 2021-02-26 DIAGNOSIS — J449 Chronic obstructive pulmonary disease, unspecified: Secondary | ICD-10-CM | POA: Diagnosis not present

## 2021-02-26 DIAGNOSIS — I5032 Chronic diastolic (congestive) heart failure: Secondary | ICD-10-CM | POA: Diagnosis not present

## 2021-02-26 DIAGNOSIS — G8220 Paraplegia, unspecified: Secondary | ICD-10-CM

## 2021-02-26 MED ORDER — CHOLESTYRAMINE 4 G PO PACK: 4.0000 g | PACK | Freq: Two times a day (BID) | ORAL | 1 refills | Status: DC | PRN

## 2021-02-26 MED ORDER — OXYCODONE-ACETAMINOPHEN 5-325 MG PO TABS: 1.0000 | ORAL_TABLET | ORAL | 0 refills | Status: DC | PRN

## 2021-02-26 NOTE — Assessment & Plan Note (Signed)
They both don't think this is overflow phenomenon Will Rx cholestyramine for prn use

## 2021-02-26 NOTE — Assessment & Plan Note (Signed)
Fair control with symbicort and tiotropium Still needs the albuterol daily

## 2021-02-26 NOTE — Assessment & Plan Note (Signed)
No movement in legs at all Daughter manages his care--she is his paid caregiver

## 2021-02-26 NOTE — Assessment & Plan Note (Signed)
Compensated now 

## 2021-02-26 NOTE — Assessment & Plan Note (Signed)
Likely from his spinal stenosis Gabapentin helps in the night--but not in the day Will try percocet (though some itching with past narcotics)

## 2021-02-26 NOTE — Assessment & Plan Note (Signed)
Seems to be controlled with leflunomide
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
## 2021-03-05 ENCOUNTER — Telehealth: Payer: Self-pay | Admitting: Diagnostic Neuroimaging

## 2021-03-05 NOTE — Telephone Encounter (Signed)
Contacted pt back, Lvm rq call back

## 2021-03-05 NOTE — Telephone Encounter (Signed)
Pt called wants to discuss his MRI results. Pt requesting a call back.

## 2021-03-06 NOTE — Telephone Encounter (Signed)
Contacted pt again, no answer  

## 2021-03-10 ENCOUNTER — Encounter: Payer: Self-pay | Admitting: Internal Medicine

## 2021-03-11 NOTE — Telephone Encounter (Signed)
Appointment has been rescheduled to first available 06/23/21.

## 2021-03-12 ENCOUNTER — Encounter: Payer: Self-pay | Admitting: Family Medicine

## 2021-03-12 NOTE — Telephone Encounter (Signed)
Pt returned called, went over results again with pt. He was fine with a neurosurgery consult.

## 2021-03-20 ENCOUNTER — Telehealth: Payer: Self-pay | Admitting: Internal Medicine

## 2021-03-20 MED ORDER — SILVER SULFADIAZINE 1 % EX CREA: 1.0000 "application " | TOPICAL_CREAM | Freq: Every day | CUTANEOUS | 3 refills | Status: DC

## 2021-03-20 MED ORDER — DIPHENOXYLATE-ATROPINE 2.5-0.025 MG PO TABS: 1.0000 | ORAL_TABLET | Freq: Two times a day (BID) | ORAL | 0 refills | Status: DC | PRN

## 2021-03-20 NOTE — Telephone Encounter (Signed)
Mr. Jonathan Glass called in and stated that he is needing a stronger diarrhea medicine and he is bed sores are bad and he is needing cream.

## 2021-03-20 NOTE — Telephone Encounter (Signed)
Spoke to pt. Per Dr Silvio Pate, continue cholestyromine up to 4 times daily. Stop Imodium.

## 2021-03-20 NOTE — Telephone Encounter (Signed)
Please let him know I sent lomotil to try---very powerful, he should be careful.  Silvadene cream for the ulcers---a thick layer daily to protect the areas

## 2021-03-20 NOTE — Addendum Note (Signed)
Addended by: Viviana Simpler I on: 03/20/2021 01:37 PM   Modules accepted: Orders

## 2021-03-21 ENCOUNTER — Other Ambulatory Visit: Payer: Self-pay | Admitting: Internal Medicine

## 2021-04-01 ENCOUNTER — Encounter: Payer: Self-pay | Admitting: Internal Medicine

## 2021-04-02 ENCOUNTER — Other Ambulatory Visit: Payer: Self-pay | Admitting: Internal Medicine

## 2021-04-02 MED ORDER — OXYCODONE-ACETAMINOPHEN 5-325 MG PO TABS: 1.0000 | ORAL_TABLET | ORAL | 0 refills | Status: DC | PRN

## 2021-04-02 NOTE — Telephone Encounter (Signed)
Spoke to patient by telephone and was advised that he is still having diarrhea that comes and goes. Patient stated that he will go for 2 days without diarrhea and then 5 of diarrhea. Patient stated the diarrhea was real bad yesterday. Patient stated that he seems to be doing fine this morning. Patient stated that he was drinking about 12 cups of coffee a day and his daughter has cut that back to one a day. Patient denies any abdominal pain at this time. Patient stated that his right leg burns and feels like electrical shocks in it at times. Patient stated that the oxycodone helps with his leg pain. Patient was advised if he continues to have the diarrhea he may need to go to the ER to be evaluated and may need a CT scan done. Patient stated that he thinks that he had a whole scan of his body done last month. Patient was advised that Dr. Silvio Pate will refill his Oxycodone. Patient was given ER precautions and he verbalized understanding.

## 2021-04-15 NOTE — Telephone Encounter (Signed)
[place] Night - Client Nonclinical Telephone Record  [facility] Client [facility] Night - Client Client Site [place] Provider Viviana Simpler- MD Contact Type Call Who Is Calling Patient / Member / Family / Caregiver Caller Name Jonathan Glass Caller Phone Number [phone] Patient Name Jonathan Glass Patient DOB Jan 29, 1941 Call Type Message Only Information Provided Reason for Call Medication Question / Request Initial Comment Caller states he is getting diarrhea again. The prescription, Diphenoxylate, he was given is not stopping it . It is starting to pick up again in the mornings . Additional Comment Provided office hours Disp. Time Disposition Final User 04/15/2021 7:49:30 AM General Information Provided Yes Vinnie Level Call Closed By: Vinnie Level Transaction Date/Time: 04/15/2021 7:44:42 AM (ET

## 2021-04-15 NOTE — Telephone Encounter (Signed)
Spoke to pt. He will call the [facility] in [place] to see if he can get in. He will also check with Jule Ser.

## 2021-04-15 NOTE — Telephone Encounter (Signed)
[name], MD to [name]      1:22 PM I did refill the oxycodone for you and increased the quantity to 60. I am not sure what to do about the diarrhea since I really don't know what is causing it. A GI specialist might be good if the [facility] would set this up  Last read by [name] at  8:03 AM on 04/07/2021.   Pt first said he did not get this message and then when I read message to pt he said that a GI appt at [facility] in [place] was too far for pt to go. Pt said no abd pain, no fever,no N&V. Pt last had watery diarrhea this morning.  In last 12 hours had diarrhea x 2.pt has dry mouth but no dizziness. Pt said he is drinking a lot of water. Offered pt an appt on 04/16/21 but pt said to send note to DR Silvio Pate to see what he thinks pt should do. [facility] [place]. Pt said the diphenoxylate-atropine is not helping diarrhea at all. Sending note to Dr Silvio Pate and Larene Beach CMA. Pt request cb after reviewed by Dr Silvio Pate.

## 2021-05-08 ENCOUNTER — Telehealth: Payer: Self-pay | Admitting: Internal Medicine

## 2021-05-08 NOTE — Telephone Encounter (Signed)
Spoke to pt. Advised about 3 weeks.

## 2021-05-08 NOTE — Telephone Encounter (Signed)
Mr. Cosimo called in and wanted to know the next time Dr. Silvio Pate is coming back to the home.

## 2021-05-28 ENCOUNTER — Encounter: Payer: Self-pay | Admitting: Internal Medicine

## 2021-05-28 ENCOUNTER — Other Ambulatory Visit: Payer: Self-pay

## 2021-05-28 ENCOUNTER — Ambulatory Visit (INDEPENDENT_AMBULATORY_CARE_PROVIDER_SITE_OTHER): Payer: PPO | Admitting: Internal Medicine

## 2021-05-28 DIAGNOSIS — F39 Unspecified mood [affective] disorder: Secondary | ICD-10-CM

## 2021-05-28 DIAGNOSIS — M792 Neuralgia and neuritis, unspecified: Secondary | ICD-10-CM | POA: Diagnosis not present

## 2021-05-28 DIAGNOSIS — J449 Chronic obstructive pulmonary disease, unspecified: Secondary | ICD-10-CM | POA: Diagnosis not present

## 2021-05-28 DIAGNOSIS — R197 Diarrhea, unspecified: Secondary | ICD-10-CM

## 2021-05-28 DIAGNOSIS — I5032 Chronic diastolic (congestive) heart failure: Secondary | ICD-10-CM

## 2021-05-28 DIAGNOSIS — M05742 Rheumatoid arthritis with rheumatoid factor of left hand without organ or systems involvement: Secondary | ICD-10-CM | POA: Diagnosis not present

## 2021-05-28 DIAGNOSIS — I10 Essential (primary) hypertension: Secondary | ICD-10-CM | POA: Diagnosis not present

## 2021-05-28 DIAGNOSIS — M05741 Rheumatoid arthritis with rheumatoid factor of right hand without organ or systems involvement: Secondary | ICD-10-CM

## 2021-05-28 DIAGNOSIS — L97912 Non-pressure chronic ulcer of unspecified part of right lower leg with fat layer exposed: Secondary | ICD-10-CM

## 2021-05-28 DIAGNOSIS — G8220 Paraplegia, unspecified: Secondary | ICD-10-CM | POA: Diagnosis not present

## 2021-05-28 DIAGNOSIS — E441 Mild protein-calorie malnutrition: Secondary | ICD-10-CM

## 2021-05-28 DIAGNOSIS — I25119 Atherosclerotic heart disease of native coronary artery with unspecified angina pectoris: Secondary | ICD-10-CM

## 2021-05-28 MED ORDER — CHOLESTYRAMINE 4 G PO PACK: 4.0000 g | PACK | Freq: Two times a day (BID) | ORAL | 0 refills | Status: AC

## 2021-05-28 MED ORDER — DIPHENOXYLATE-ATROPINE 2.5-0.025 MG PO TABS: 1.0000 | ORAL_TABLET | Freq: Two times a day (BID) | ORAL | 0 refills | Status: AC | PRN

## 2021-05-28 MED ORDER — SILVER SULFADIAZINE 1 % EX CREA: 1.0000 "application " | TOPICAL_CREAM | Freq: Every day | CUTANEOUS | 3 refills | Status: AC

## 2021-05-28 MED ORDER — OXYCODONE-ACETAMINOPHEN 5-325 MG PO TABS: 1.0000 | ORAL_TABLET | ORAL | 0 refills | Status: AC | PRN

## 2021-05-28 NOTE — Assessment & Plan Note (Signed)
Compensated now without Rx ?Has lost weight ?No fluid overload ?

## 2021-05-28 NOTE — Assessment & Plan Note (Signed)
Discussed using daily saline wet to dry dressing till the green slough is off---then back to silvadene daily ?

## 2021-05-28 NOTE — Assessment & Plan Note (Signed)
Better lately with bid cholestyramine ?Prn lomotil ?

## 2021-05-28 NOTE — Assessment & Plan Note (Signed)
No clear ischemic chest pain ?Breathing seems better in the chair ?ASA only ?

## 2021-05-28 NOTE — Assessment & Plan Note (Signed)
No exacerbations with symbicort bid, spiriva daily ?Has the albuterol for prn ?
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
## 2021-05-28 NOTE — Assessment & Plan Note (Signed)
Now felt to be related to agent orange exposure ?Has benefits from [place] (daughter is paid caregiver) ?Bedbound---doing better in bed than recliner ?

## 2021-05-28 NOTE — Assessment & Plan Note (Signed)
Has lost more weight and muscle mass ?Daughter tries her best to get him to eat ?

## 2021-05-28 NOTE — Assessment & Plan Note (Signed)
Leg pain can be very severe ?Will refill the oxycodone ?

## 2021-05-28 NOTE — Assessment & Plan Note (Signed)
No active synovitis in hands on the arava ?

## 2021-05-28 NOTE — Assessment & Plan Note (Signed)
Still gets frustrated/panic----uses the diazepam prn ?Some depression/frustration---okay without meds ?

## 2021-05-28 NOTE — Assessment & Plan Note (Signed)
BP Readings from Last 3 Encounters:  ?05/28/21 (!) 132/56  ?02/26/21 136/64  ?12/18/20 (!) 122/56  ? ?Okay on the amlodipine ?

## 2021-06-18 ENCOUNTER — Telehealth: Payer: Self-pay

## 2021-06-18 NOTE — Chronic Care Management (AMB) (Signed)
? ? ?  Chronic Care Management ?Pharmacy Assistant  ? ?Name: Jonathan Glass  MRN: [id number] DOB: [birth year] ? ? ?Reason for Encounter: Reminder Call ?  ?Conditions to be addressed/monitored: ?HTN, HLD, and COPD ? ? ?Medications: ?Outpatient Encounter Medications as of 06/18/2021  ?Medication Sig  ? albuterol (PROVENTIL HFA;VENTOLIN HFA) 108 (90 Base) MCG/ACT inhaler Inhale 2 puffs into the lungs every 6 (six) hours as needed for wheezing or shortness of breath.  ? allopurinol (ZYLOPRIM) 300 MG tablet Take 300 mg by mouth daily. 100 mg + 300 mg daily  ? amLODipine (NORVASC) 10 MG tablet Take 5 mg by mouth daily.  ? aspirin EC 81 MG tablet Take 81 mg by mouth daily.  ? budesonide-formoterol (SYMBICORT) 160-4.5 MCG/ACT inhaler Inhale 2 puffs into the lungs 2 (two) times daily.  ? cholestyramine (QUESTRAN) 4 g packet Take 1 packet (4 g total) by mouth 2 (two) times daily. For severe diarrhea  ? diazepam (VALIUM) 2 MG tablet Take 1 tablet (2 mg total) by mouth 3 (three) times daily as needed for anxiety.  ? diphenoxylate-atropine (LOMOTIL) 2.5-0.025 MG tablet Take 1 tablet by mouth 2 (two) times daily as needed for diarrhea or loose stools.  ? ezetimibe (ZETIA) 10 MG tablet Take 5 mg by mouth daily.  ? fluticasone (FLONASE) 50 MCG/ACT nasal spray Place 2 sprays into the nose daily.  ? gabapentin (NEURONTIN) 300 MG capsule Take 600 mg by mouth 3 (three) times daily.  ? leflunomide (ARAVA) 20 MG tablet Take 1 tablet by mouth daily.  ? montelukast (SINGULAIR) 10 MG tablet Take 1 tablet (10 mg total) by mouth at bedtime.  ? omeprazole (PRILOSEC) 20 MG capsule Take 1 capsule (20 mg total) by mouth daily.  ? oxyCODONE-acetaminophen (PERCOCET/ROXICET) 5-325 MG tablet Take 1 tablet by mouth every 4 (four) hours as needed for severe pain.  ? silver sulfADIAZINE (SILVADENE) 1 % cream Apply 1 application. topically daily. To bedsores  ? tiotropium (SPIRIVA HANDIHALER) 18 MCG inhalation capsule Place 1 capsule (18 mcg total) into  inhaler and inhale daily.  ? ?No facility-administered encounter medications on file as of 06/18/2021.  ? ?[name] was contacted to remind of upcoming telephone visit with [name] on 06/23/21 at 3:00pm. Patient was reminded to have any blood glucose and blood pressure readings available for review at appointment.  ? ?Message was left reminding patient of appointment. ? ?Star Rating Drugs: ?Medication:  Last Fill: Day Supply ?No star medications identified ? ?Amlodipine '10mg'$    no data found-  off of this med per PCP note 05/28/21 ? ? ?[name], CPP notified ? ?Lashala Laser, CCMA ?Health concierge  ?[phone]  ?

## 2021-06-23 ENCOUNTER — Ambulatory Visit (INDEPENDENT_AMBULATORY_CARE_PROVIDER_SITE_OTHER): Payer: PPO | Admitting: Pharmacist

## 2021-06-23 DIAGNOSIS — I25119 Atherosclerotic heart disease of native coronary artery with unspecified angina pectoris: Secondary | ICD-10-CM

## 2021-06-23 DIAGNOSIS — I5032 Chronic diastolic (congestive) heart failure: Secondary | ICD-10-CM

## 2021-06-23 DIAGNOSIS — I10 Essential (primary) hypertension: Secondary | ICD-10-CM

## 2021-06-23 DIAGNOSIS — J449 Chronic obstructive pulmonary disease, unspecified: Secondary | ICD-10-CM

## 2021-06-23 DIAGNOSIS — F39 Unspecified mood [affective] disorder: Secondary | ICD-10-CM

## 2021-06-23 NOTE — Progress Notes (Signed)
? ?Chronic Care Management ?Pharmacy Note ? ?07/03/2021 ?Name:  Jonathan Glass MRN:  [id number] DOB:  1941-01-31 ? ?Summary: CCM F/U visit ?-Pt endorses compliance with medications. He gets maintenance meds through the [place] ?-Pt denies issues with meds. No medication-related barriers identified. ? ?Recommendations/Changes made from today's visit: ?-No med changes ? ?Plan: ?-Letcher will call patient 3 months for general update ?-Pharmacist follow up televisit scheduled for 6 months ?-No upcoming PCP visits scheduled ? ? ? ?Subjective: ?Jonathan Glass is an 81 y.o. year old male who is a primary patient of Letvak, Theophilus Kinds, MD.  The CCM team was consulted for assistance with disease management and care coordination needs.   ? ?Engaged with patient by telephone for follow up visit in response to provider referral for pharmacy case management and/or care coordination services.  ? ?Consent to Services:  ?The patient was given information about Chronic Care Management services, agreed to services, and gave verbal consent prior to initiation of services.  Please see initial visit note for detailed documentation.  ? ?Patient Care Team: ?[name], MD as PCP - General ?Sherren Mocha, MD as PCP - Cardiology (Cardiology) ?Ailani Governale, Cleaster Corin, [facility] as Pharmacist (Pharmacist) ? ?Recent office visits: ?05/28/21 Dr Silvio Pate OV: Home visit - diarrhea improved w/ cholestyramine. ? ?02/26/21 Dr Silvio Pate OV: Home visit - rx'd Oxycodone-APAP 5-325 mg. Rx'd cholesytramine for diarrhea. ? ?Recent consult visits: ?11/15/20 Dr Leta Baptist (Neurology): new pt for spinal stenosis, weakness. Check MRI, EMG, labs. Imaging 02/2021 - neurosurgery consult recommended. ? ?Hospital visits: ?None in previous 6 months ? ? ?Objective: ? ?Lab Results  ?Component Value Date  ? CREATININE 1.30 09/12/2019  ? BUN 16 09/12/2019  ? GFR 53.07 (L) 09/12/2019  ? GFRNONAA 58 (L) 10/04/2018  ? GFRAA 67 10/04/2018  ? NA 136 09/12/2019  ? K 4.1  09/12/2019  ? CALCIUM 9.3 09/12/2019  ? CO2 29 09/12/2019  ? GLUCOSE 103 (H) 09/12/2019  ? ? ?Lab Results  ?Component Value Date/Time  ? HGBA1C 5.1 11/15/2020 11:56 AM  ? GFR 53.07 (L) 09/12/2019 12:08 PM  ? GFR 55.18 (L) 12/17/2017 07:39 AM  ?  ?Last diabetic Eye exam: No results found for: HMDIABEYEEXA  ?Last diabetic Foot exam: No results found for: HMDIABFOOTEX  ? ?Lab Results  ?Component Value Date  ? CHOL 175 08/11/2017  ? HDL 45.80 08/11/2017  ? LDLCALC 100 (H) 08/11/2017  ? TRIG 143.0 08/11/2017  ? CHOLHDL 4 08/11/2017  ? ? ? ?  Latest Ref Rng & Units 09/12/2019  ? 12:08 PM 12/17/2017  ?  7:39 AM 09/10/2017  ?  7:39 AM  ?Hepatic Function  ?Total Protein 6.0 - 8.3 g/dL 7.0   6.9   6.7    ?Albumin 3.5 - 5.2 g/dL 3.9   3.7   3.9    ?AST 0 - 37 U/L $Remo'17   17   19    'WXeMF$ ?ALT 0 - 53 U/L $Remo'10   15   17    'uzSRP$ ?Alk Phosphatase 39 - 117 U/L 84   76   71    ?Total Bilirubin 0.2 - 1.2 mg/dL 0.7   0.7   0.6    ? ? ?Lab Results  ?Component Value Date/Time  ? TSH 2.01 07/20/2017 09:38 AM  ? TSH 1.82 06/21/2012 08:47 AM  ? FREET4 0.76 09/11/2014 11:35 AM  ? ? ? ?  Latest Ref Rng & Units 09/12/2019  ? 12:08 PM 10/04/2018  ?  8:44 AM  12/17/2017  ?  7:39 AM  ?CBC  ?WBC 4.0 - 10.5 K/uL 9.2   6.6   7.6    ?Hemoglobin 13.0 - 17.0 g/dL 13.5   14.7   14.6    ?Hematocrit 39.0 - 52.0 % 39.9   44.9   43.7    ?Platelets 150.0 - 400.0 K/uL 182.0   201   189.0    ? ? ?No results found for: VD25OH ? ?Clinical ASCVD: Yes  ?The ASCVD Risk score (Arnett DK, et al., 2019) failed to calculate for the following reasons: ?  The 2019 ASCVD risk score is only valid for ages 40 to 66   ? ? ?  01/27/2015  ? 10:56 AM 12/21/2012  ?  8:37 AM  ?Depression screen PHQ 2/9  ?Decreased Interest 0 1  ?Down, Depressed, Hopeless 0 0  ?PHQ - 2 Score 0 1  ?  ? ?Social History  ? ?Tobacco Use  ?Smoking Status Former  ? Packs/day: 1.50  ? Years: 15.00  ? Pack years: 22.50  ? Types: Cigarettes  ? Quit date: 03/16/1981  ? Years since quitting: 40.3  ?Smokeless Tobacco Never  ? ?BP  Readings from Last 3 Encounters:  ?05/28/21 (!) 132/56  ?02/26/21 136/64  ?12/18/20 (!) 122/56  ? ?Pulse Readings from Last 3 Encounters:  ?05/28/21 72  ?02/26/21 68  ?12/18/20 60  ? ?Wt Readings from Last 3 Encounters:  ?02/26/21 171 lb (77.6 kg)  ?05/29/20 184 lb (83.5 kg)  ?04/11/20 178 lb (80.7 kg)  ? ?BMI Readings from Last 3 Encounters:  ?02/26/21 24.54 kg/m?  ?05/29/20 26.40 kg/m?  ?04/11/20 25.54 kg/m?  ? ? ?Assessment/Interventions: Review of patient past medical history, allergies, medications, health status, including review of consultants reports, laboratory and other test data, was performed as part of comprehensive evaluation and provision of chronic care management services.  ? ?SDOH:  (Social Determinants of Health) assessments and interventions performed: No ? ?SDOH Screenings  ? ?Alcohol Screen: Not on file  ?Depression (PHQ2-9): Not on file  ?Financial Resource Strain: Not on file  ?Food Insecurity: Not on file  ?Housing: Not on file  ?Physical Activity: Not on file  ?Social Connections: Not on file  ?Stress: Not on file  ?Tobacco Use: Medium Risk  ? Smoking Tobacco Use: Former  ? Smokeless Tobacco Use: Never  ? Passive Exposure: Not on file  ?Transportation Needs: Not on file  ? ? ?[place] ? ?Allergies  ?Allergen Reactions  ? Statins Other (See Comments)  ?  REACTION: myalgia with crestor,advicor,lipitor,pravachol  ? Lyrica [Pregabalin] Other (See Comments)  ?  Worse swelling ?Felt bad  ? Hydrocodone-Acetaminophen Itching  ? Tramadol Hcl Itching and Rash  ? ? ?Medications Reviewed Today   ? ? Reviewed by [name], MD (Physician) on 05/28/21 at 1424  Med List Status: <None>  ? ?Medication Order Taking? Sig Documenting Provider Last Dose Status Informant  ?albuterol (PROVENTIL HFA;VENTOLIN HFA) 108 (90 Base) MCG/ACT inhaler [id number] No Inhale 2 puffs into the lungs every 6 (six) hours as needed for wheezing or shortness of breath. [provider] Taking Active Self   ?allopurinol (ZYLOPRIM) 300 MG tablet [id number] No Take 300 mg by mouth daily. 100 mg + 300 mg daily [provider] Taking Active Self  ?amLODipine (NORVASC) 10 MG tablet [id number] No Take 5 mg by mouth daily. [provider] Taking Active Self  ?aspirin EC 81 MG tablet [id number] No Take 81 mg by mouth daily. [provider]  Taking Active   ?budesonide-formoterol (SYMBICORT) 160-4.5 MCG/ACT inhaler [id number] No Inhale 2 puffs into the lungs 2 (two) times daily. [provider] Taking Active   ?cholestyramine (QUESTRAN) 4 g packet [id number]  TAKE 1 PACKET (4 G TOTAL) BY MOUTH 2 (TWO) TIMES DAILY AS NEEDED. FOR SEVERE DIARRHEA [name], MD  Active   ?diazepam (VALIUM) 2 MG tablet [id number] No Take 1 tablet (2 mg total) by mouth 3 (three) times daily as needed for anxiety. [name], MD Taking Active   ?diphenoxylate-atropine (LOMOTIL) 2.5-0.025 MG tablet [id number]  TAKE 1 TABLET BY MOUTH 2 (TWO) TIMES DAILY AS NEEDED FOR DIARRHEA OR LOOSE STOOLS. [name], MD  Active   ?ezetimibe (ZETIA) 10 MG tablet [id number] No Take 5 mg by mouth daily. [provider] Taking Active Self  ?fluticasone (FLONASE) 50 MCG/ACT nasal spray [id number] No Place 2 sprays into the nose as needed for allergies or rhinitis. [provider] Taking Active Self  ?gabapentin (NEURONTIN) 300 MG capsule [id number] No Take 600 mg by mouth 3 (three) times daily. [provider] Taking Active   ?leflunomide (ARAVA) 20 MG tablet [id number] No  [provider] Taking Active   ?montelukast (SINGULAIR) 10 MG tablet [id number] No Take 1 tablet (10 mg total) by mouth at bedtime. [name], MD Taking Active   ?omeprazole (PRILOSEC) 20 MG capsule [id number] No Take 1 capsule (20 mg total) by mouth daily. [name], MD Taking Active Self  ?oxyCODONE-acetaminophen (PERCOCET/ROXICET) 5-325 MG tablet [id number]  Take 1 tablet by mouth every 4 (four) hours as  needed for severe pain. [name], MD  Active   ?silver sulfADIAZINE (SILVADENE) 1 % cream [id number]  Apply 1 application topically daily. To bedsores [name], MD  Active   ?tiotropium (SPIRIVA HA

## 2021-07-02 ENCOUNTER — Encounter: Payer: Self-pay | Admitting: Internal Medicine

## 2021-07-03 NOTE — Patient Instructions (Signed)
Visit Information ? ?Phone number for Pharmacist: [phone] ? ? Goals Addressed   ?None ?  ? ? ?Care Plan : Pharmacy Care Plan  ?Updates made by Charlton Haws, RPH since 07/03/2021 12:00 AM  ?  ? ?Problem: Hypertension, Hyperlipidemia, Heart Failure, Coronary Artery Disease, COPD, and Anxiety   ?Priority: High  ?  ? ?Long-Range Goal: Disease mgmt   ?Start Date: 07/03/2021  ?Expected End Date: 07/04/2022  ?This Visit's Progress: On track  ?Priority: High  ?Note:   ?Current Barriers:  ?None identified - pt gets a lot of assistance from [place] ? ?Pharmacist Clinical Goal(s):  ?Patient will contact provider office for questions/concerns as evidenced notation of same in electronic health record through collaboration with PharmD and provider.  ? ?Interventions: ?1:1 collaboration with [name], MD regarding development and update of comprehensive plan of care as evidenced by provider attestation and co-signature ?Inter-disciplinary care team collaboration (see longitudinal plan of care) ?Comprehensive medication review performed; medication list updated in electronic medical record ? ?Hypertension / Heart Failure (BP goal <140/90) ?-Controlled - BP at goal in recent office visits ?-Last ejection fraction: unknown; last ECHO 05/2014 "unremarkable" ?-HF type: Diastolic ?-Current treatment: ?Amlodipine 10 mg - 1/2 tab daily - Appropriate, Effective, Safe, Accessible ?-Medications previously tried: furosemide, losartan, metoprolol ?-Educated on BP goals and benefits of medications for prevention of heart attack, stroke and kidney damage; ?-Counseled to monitor BP at home periodically ?-Recommended to continue current medication ? ?Hyperlipidemia: (LDL goal < 100) ?-Query controlled - LDL 100 (07/2017), overdue for repeat labs however given age, functional status and decision to forego statin indefinitely, new lipid panel may not change current therapy plan ?-Hx aortic atherosclerosis on CT. Hold off on statin given  poor status per PCP ?-Current treatment: ?Ezetimibe 10 mg - 1/2 tab daily - Appropriate, Query Effective ?Aspirin 81 mg daily - Appropriate, Effective, Safe, Accessible ?-Medications previously tried: none  ?-Educated on Cholesterol goals;  ?-Recommended to continue current medication; consider repeat lipid panel at next PCP visit ? ?COPD (Goal: control symptoms and prevent exacerbations) ?-Controlled - per pt report ?-Gold Grade: unknown/unable to assess ?-Current COPD Classification:  unable to assess ?-Pulmonary function testing: none on file ?-Exacerbations requiring treatment in last 6 months: 0 ?-Current treatment  ?Spiriva Handihaler 1 puff daily - Appropriate, Effective, Safe, Accessible ?Symbicort 160-4.5 mcg/act 2 puff BID -Appropriate, Effective, Safe, Accessible ?Albuterol HFA prn -Appropriate, Effective, Safe, Accessible ?-Medications previously tried: n/a  ?-Patient reports consistent use of maintenance inhaler ?-Frequency of rescue inhaler use: 2-3x daily ?-Counseled on Benefits of consistent maintenance inhaler use ?-Recommended to continue current medication ? ?Pain (Goal: manage symptoms) ?-Controlled - per pt report; he tries to limit oxycodone use to more severe pain ?-Current treatment  ?Gabapentin 300 mg -Appropriate, Effective, Safe, Accessible ?Oxycodone-APAP 5-325 mg - about 1 per day -Appropriate, Effective, Safe, Accessible ?-Medications previously tried: n/a  ?-Recommended to continue current medication ? ?Anxiety / Panic (Goal: manage symptoms) ?-Controlled - per pt report; he does not use diazepam daily ?-Current treatment  ?Diazepam 2 mg PRN ?-Medications previously tried: sertraline  ?-Recommended to continue current medication ? ? ?Patient Goals/Self-Care Activities ?Patient will:  ?- take medications as prescribed as evidenced by patient report and record review ?focus on medication adherence by pill box ?  ?  ? ?Patient verbalizes understanding of instructions and care plan  provided today and agrees to view in [place]. Active MyChart status confirmed with patient.   ?Telephone follow up appointment with pharmacy team  member scheduled for: 6 months ? ?[name], PharmD, BCACP ?Clinical Pharmacist ?[facility] ?[phone] ?  ?

## 2021-07-13 DIAGNOSIS — E785 Hyperlipidemia, unspecified: Secondary | ICD-10-CM

## 2021-07-13 DIAGNOSIS — I110 Hypertensive heart disease with heart failure: Secondary | ICD-10-CM | POA: Diagnosis not present

## 2021-07-13 DIAGNOSIS — J449 Chronic obstructive pulmonary disease, unspecified: Secondary | ICD-10-CM | POA: Diagnosis not present

## 2021-07-13 DIAGNOSIS — I5030 Unspecified diastolic (congestive) heart failure: Secondary | ICD-10-CM | POA: Diagnosis not present

## 2021-07-14 DEATH — deceased

## 2021-09-26 ENCOUNTER — Telehealth: Payer: Self-pay

## 2021-09-26 NOTE — Chronic Care Management (AMB) (Signed)
    Chronic Care Management Pharmacy Assistant   Name: Jonathan Glass  MRN: [id number] DOB: Jun 18, 1940  Reason for Encounter: General Adherence  Attempted contact with [name] 3 times on 09/26/21,09/30/21,10/09/21. Unsuccessful outreach. Will attempt contact next month.   Recent office visits:  None since last CCM contact  Recent consult visits:  None since last CCM contact  Hospital visits:  None in previous 6 months  Medications: Outpatient Encounter Medications as of 09/26/2021  Medication Sig   albuterol (PROVENTIL HFA;VENTOLIN HFA) 108 (90 Base) MCG/ACT inhaler Inhale 2 puffs into the lungs every 6 (six) hours as needed for wheezing or shortness of breath.   allopurinol (ZYLOPRIM) 300 MG tablet Take 300 mg by mouth daily. 100 mg + 300 mg daily   amLODipine (NORVASC) 10 MG tablet Take 5 mg by mouth daily.   aspirin EC 81 MG tablet Take 81 mg by mouth daily.   budesonide-formoterol (SYMBICORT) 160-4.5 MCG/ACT inhaler Inhale 2 puffs into the lungs 2 (two) times daily.   cholestyramine (QUESTRAN) 4 g packet Take 1 packet (4 g total) by mouth 2 (two) times daily. For severe diarrhea   diazepam (VALIUM) 2 MG tablet Take 1 tablet (2 mg total) by mouth 3 (three) times daily as needed for anxiety.   diphenoxylate-atropine (LOMOTIL) 2.5-0.025 MG tablet Take 1 tablet by mouth 2 (two) times daily as needed for diarrhea or loose stools.   ezetimibe (ZETIA) 10 MG tablet Take 5 mg by mouth daily.   fluticasone (FLONASE) 50 MCG/ACT nasal spray Place 2 sprays into the nose daily.   gabapentin (NEURONTIN) 300 MG capsule Take 600 mg by mouth 3 (three) times daily.   leflunomide (ARAVA) 20 MG tablet Take 1 tablet by mouth daily.   montelukast (SINGULAIR) 10 MG tablet Take 1 tablet (10 mg total) by mouth at bedtime.   omeprazole (PRILOSEC) 20 MG capsule Take 1 capsule (20 mg total) by mouth daily.   oxyCODONE-acetaminophen (PERCOCET/ROXICET) 5-325 MG tablet Take 1 tablet by mouth every 4  (four) hours as needed for severe pain.   silver sulfADIAZINE (SILVADENE) 1 % cream Apply 1 application. topically daily. To bedsores   tiotropium (SPIRIVA HANDIHALER) 18 MCG inhalation capsule Place 1 capsule (18 mcg total) into inhaler and inhale daily.   No facility-administered encounter medications on file as of 09/26/2021.    Star Medications: Medication Name/mg Last Fill Days Supply No star medications identified   Amlodipine '10mg'$    [facility] medication  no data    Care Gaps: Annual wellness visit in last year? No Most Recent BP reading:132/56  72-P  05/28/21  Upcoming appointments: CCM appointment on 12/22/21   [name], CPP notified  Avel Sensor, [place]  [phone]

## 2021-10-23 ENCOUNTER — Telehealth: Payer: Self-pay

## 2021-10-23 NOTE — Chronic Care Management (AMB) (Signed)
    Chronic Care Management Pharmacy Assistant   Name: Jonathan Glass  MRN: [id number] DOB: Jul 14, 1940   Reason for Encounter: General Adherence    Attempted contact with [name] 3 times on 10/30/21,11/05/21,11/12/21. Unsuccessful outreach. Will attempt contact next month.   Medications: Outpatient Encounter Medications as of 10/23/2021  Medication Sig   albuterol (PROVENTIL HFA;VENTOLIN HFA) 108 (90 Base) MCG/ACT inhaler Inhale 2 puffs into the lungs every 6 (six) hours as needed for wheezing or shortness of breath.   allopurinol (ZYLOPRIM) 300 MG tablet Take 300 mg by mouth daily. 100 mg + 300 mg daily   amLODipine (NORVASC) 10 MG tablet Take 5 mg by mouth daily.   aspirin EC 81 MG tablet Take 81 mg by mouth daily.   budesonide-formoterol (SYMBICORT) 160-4.5 MCG/ACT inhaler Inhale 2 puffs into the lungs 2 (two) times daily.   cholestyramine (QUESTRAN) 4 g packet Take 1 packet (4 g total) by mouth 2 (two) times daily. For severe diarrhea   diazepam (VALIUM) 2 MG tablet Take 1 tablet (2 mg total) by mouth 3 (three) times daily as needed for anxiety.   diphenoxylate-atropine (LOMOTIL) 2.5-0.025 MG tablet Take 1 tablet by mouth 2 (two) times daily as needed for diarrhea or loose stools.   ezetimibe (ZETIA) 10 MG tablet Take 5 mg by mouth daily.   fluticasone (FLONASE) 50 MCG/ACT nasal spray Place 2 sprays into the nose daily.   gabapentin (NEURONTIN) 300 MG capsule Take 600 mg by mouth 3 (three) times daily.   leflunomide (ARAVA) 20 MG tablet Take 1 tablet by mouth daily.   montelukast (SINGULAIR) 10 MG tablet Take 1 tablet (10 mg total) by mouth at bedtime.   omeprazole (PRILOSEC) 20 MG capsule Take 1 capsule (20 mg total) by mouth daily.   oxyCODONE-acetaminophen (PERCOCET/ROXICET) 5-325 MG tablet Take 1 tablet by mouth every 4 (four) hours as needed for severe pain.   silver sulfADIAZINE (SILVADENE) 1 % cream Apply 1 application. topically daily. To bedsores   tiotropium  (SPIRIVA HANDIHALER) 18 MCG inhalation capsule Place 1 capsule (18 mcg total) into inhaler and inhale daily.   No facility-administered encounter medications on file as of 10/23/2021.   Star Medications: Medication Name/mg Last Fill Days Supply No star medications identified   Upcoming appointments: CCM appointment on 12/22/21  [name], CPP notified  Avel Sensor, [place]  [phone]

## 2021-12-22 ENCOUNTER — Telehealth: Payer: PPO
